# Patient Record
Sex: Female | Born: 1960 | Race: White | Hispanic: No | Smoking: Current every day smoker
Health system: Southern US, Community
[De-identification: ages and names within clinical notes are randomized; demographics above are authoritative.]

## PROBLEM LIST (undated history)

## (undated) DIAGNOSIS — K219 Gastro-esophageal reflux disease without esophagitis: Secondary | ICD-10-CM

## (undated) DIAGNOSIS — M797 Fibromyalgia: Secondary | ICD-10-CM

## (undated) DIAGNOSIS — I10 Essential (primary) hypertension: Secondary | ICD-10-CM

## (undated) DIAGNOSIS — F419 Anxiety disorder, unspecified: Secondary | ICD-10-CM

## (undated) DIAGNOSIS — J45909 Unspecified asthma, uncomplicated: Secondary | ICD-10-CM

## (undated) DIAGNOSIS — F32A Depression, unspecified: Secondary | ICD-10-CM

## (undated) DIAGNOSIS — I493 Ventricular premature depolarization: Secondary | ICD-10-CM

## (undated) HISTORY — PX: SINUS EXPLORATION: SHX5214

## (undated) HISTORY — PX: BREAST SURGERY: SHX581

## (undated) HISTORY — PX: COSMETIC SURGERY: SHX468

## (undated) HISTORY — PX: CHOLECYSTECTOMY: SHX55

## (undated) HISTORY — DX: Essential (primary) hypertension: I10

## (undated) HISTORY — PX: TUBAL LIGATION: SHX77

## (undated) HISTORY — PX: CARDIAC CATHETERIZATION: SHX172

## (undated) HISTORY — PX: BREAST ENHANCEMENT SURGERY: SHX7

## (undated) HISTORY — DX: Gastro-esophageal reflux disease without esophagitis: K21.9

## (undated) HISTORY — DX: Unspecified asthma, uncomplicated: J45.909

## (undated) HISTORY — PX: EYE SURGERY: SHX253

---

## 2001-10-18 ENCOUNTER — Emergency Department (HOSPITAL_COMMUNITY): Admission: EM | Admit: 2001-10-18 | Discharge: 2001-10-18 | Payer: Self-pay | Admitting: Emergency Medicine

## 2015-06-27 DIAGNOSIS — E782 Mixed hyperlipidemia: Secondary | ICD-10-CM | POA: Insufficient documentation

## 2016-05-06 ENCOUNTER — Encounter (HOSPITAL_COMMUNITY): Payer: Self-pay | Admitting: Psychiatry

## 2016-05-06 ENCOUNTER — Ambulatory Visit (INDEPENDENT_AMBULATORY_CARE_PROVIDER_SITE_OTHER): Payer: Medicaid Other | Admitting: Psychiatry

## 2016-05-06 VITALS — BP 119/80 | HR 84 | Ht 59.0 in | Wt 128.0 lb

## 2016-05-06 DIAGNOSIS — F4310 Post-traumatic stress disorder, unspecified: Secondary | ICD-10-CM

## 2016-05-06 DIAGNOSIS — F4001 Agoraphobia with panic disorder: Secondary | ICD-10-CM | POA: Diagnosis not present

## 2016-05-06 DIAGNOSIS — F411 Generalized anxiety disorder: Secondary | ICD-10-CM

## 2016-05-06 MED ORDER — ESCITALOPRAM OXALATE 5 MG PO TABS: 5.0000 mg | ORAL_TABLET | Freq: Every day | ORAL | Status: DC

## 2016-05-06 NOTE — Patient Instructions (Signed)
She can continue xanax 0.5mg  prn small dose by primary care. Has been on it for long time Will add lexapro small dose of 5mg  for PTSD
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
## 2016-05-07 DIAGNOSIS — R87610 Atypical squamous cells of undetermined significance on cytologic smear of cervix (ASC-US): Secondary | ICD-10-CM | POA: Insufficient documentation

## 2016-05-11 ENCOUNTER — Other Ambulatory Visit (HOSPITAL_COMMUNITY): Payer: Self-pay | Admitting: *Deleted

## 2016-05-11 ENCOUNTER — Telehealth (HOSPITAL_COMMUNITY): Payer: Self-pay | Admitting: *Deleted

## 2016-05-11 NOTE — Telephone Encounter (Signed)
PT states she is having a tingling sensation in hands and feet after she began taking Lexapro. Pt states she is feeling more anxious, weak and shaky. Please advise.

## 2016-05-11 NOTE — Telephone Encounter (Signed)
Reviewed chart. Patient already on xanax. Advise to take one now and if no improvement can go to local ED . Monitor BP , hydrate and get fresh air. Stop lexapro for now for 3 days.  Re introduce half tablet total of 2.5 after three days with meal.  Call back for concerns.

## 2016-05-11 NOTE — Telephone Encounter (Signed)
Return telephone call to pt. Per Dr. Gilmore LarocheAkhtar, please informed pt may take 1 xanax for now,  if no improvement can go to local ED . Monitor BP , hydrate and get fresh air.Stop Lexapro for now for 3 days. Re introduce half tablet total of 2.5 after three days with meal.  Pt may call back for an earlier appt. Pt verbalizes understanding.

## 2016-05-19 ENCOUNTER — Ambulatory Visit (INDEPENDENT_AMBULATORY_CARE_PROVIDER_SITE_OTHER): Payer: Medicaid Other | Admitting: Licensed Clinical Social Worker

## 2016-05-19 DIAGNOSIS — F411 Generalized anxiety disorder: Secondary | ICD-10-CM

## 2016-05-19 DIAGNOSIS — F4001 Agoraphobia with panic disorder: Secondary | ICD-10-CM | POA: Diagnosis not present

## 2016-05-19 DIAGNOSIS — F4310 Post-traumatic stress disorder, unspecified: Secondary | ICD-10-CM
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
## 2016-05-20 ENCOUNTER — Encounter (HOSPITAL_COMMUNITY): Payer: Self-pay | Admitting: Psychiatry

## 2016-05-20 ENCOUNTER — Ambulatory Visit (INDEPENDENT_AMBULATORY_CARE_PROVIDER_SITE_OTHER): Payer: Medicaid Other | Admitting: Psychiatry

## 2016-05-20 VITALS — BP 122/72 | HR 82 | Ht 59.0 in | Wt 127.0 lb

## 2016-05-20 DIAGNOSIS — F4001 Agoraphobia with panic disorder: Secondary | ICD-10-CM

## 2016-05-20 DIAGNOSIS — F411 Generalized anxiety disorder: Secondary | ICD-10-CM | POA: Diagnosis not present

## 2016-05-20 DIAGNOSIS — F4310 Post-traumatic stress disorder, unspecified: Secondary | ICD-10-CM | POA: Diagnosis not present

## 2016-05-20 MED ORDER — MIRTAZAPINE 7.5 MG PO TABS: 7.5000 mg | ORAL_TABLET | Freq: Every day | ORAL | Status: DC
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
## 2016-05-24 ENCOUNTER — Telehealth (HOSPITAL_COMMUNITY): Payer: Self-pay | Admitting: *Deleted

## 2016-05-24 NOTE — Telephone Encounter (Signed)
Pt states she is having muscle spasms since taking Remeron. Pt states she stop medication, but would like another medication. Please advise [phone].

## 2016-05-27 ENCOUNTER — Ambulatory Visit (INDEPENDENT_AMBULATORY_CARE_PROVIDER_SITE_OTHER): Payer: Medicaid Other | Admitting: Licensed Clinical Social Worker

## 2016-05-27 DIAGNOSIS — F4310 Post-traumatic stress disorder, unspecified: Secondary | ICD-10-CM

## 2016-05-27 DIAGNOSIS — F411 Generalized anxiety disorder: Secondary | ICD-10-CM | POA: Diagnosis not present

## 2016-05-27 DIAGNOSIS — F4001 Agoraphobia with panic disorder: Secondary | ICD-10-CM | POA: Diagnosis not present

## 2016-05-27 NOTE — Telephone Encounter (Signed)
Return telephone call to pt. Per Dr. Gilmore LarocheAkhtar, please informed pt to decrease Remeron to half dose, stay hydrated . Pt states she stop taking medication last Thursday, but would like to try another medication. Informed pt we will need to schedule an appt for medication changes. Pt is schedule for a f/u appt on 6/28. Pt verbalizes understanding.
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
## 2016-05-28 ENCOUNTER — Ambulatory Visit (HOSPITAL_COMMUNITY): Payer: Medicaid Other | Admitting: Psychiatry

## 2016-06-01 ENCOUNTER — Ambulatory Visit (INDEPENDENT_AMBULATORY_CARE_PROVIDER_SITE_OTHER): Payer: Medicaid Other | Admitting: Licensed Clinical Social Worker

## 2016-06-01 DIAGNOSIS — F4310 Post-traumatic stress disorder, unspecified: Secondary | ICD-10-CM | POA: Diagnosis not present

## 2016-06-01 DIAGNOSIS — F4001 Agoraphobia with panic disorder: Secondary | ICD-10-CM | POA: Diagnosis not present

## 2016-06-01 DIAGNOSIS — F411 Generalized anxiety disorder: Secondary | ICD-10-CM | POA: Diagnosis not present
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
## 2016-06-03 ENCOUNTER — Telehealth (HOSPITAL_COMMUNITY): Payer: Self-pay | Admitting: Psychiatry

## 2016-06-03 MED ORDER — ALPRAZOLAM 0.5 MG PO TABS: 0.5000 mg | ORAL_TABLET | Freq: Every day | ORAL | Status: DC | PRN

## 2016-06-03 NOTE — Telephone Encounter (Signed)
Printed xanax for pick up

## 2016-06-03 NOTE — Telephone Encounter (Signed)
Pt will need a rx written for Xanax 0.5mg . Pt is schedule for a f/u appt on 6/28.

## 2016-06-04 NOTE — Telephone Encounter (Signed)
Forde Radonavid Grafton picked up Xanax rx for Collene SchlichterLisa Kilman on 06/04/16 at 10:47 am. Mr. Sheppard CoilGrafton's DL id # is [id number].

## 2016-06-09 ENCOUNTER — Ambulatory Visit (HOSPITAL_COMMUNITY): Payer: Medicaid Other | Admitting: Psychiatry

## 2016-06-10 ENCOUNTER — Ambulatory Visit (HOSPITAL_COMMUNITY): Payer: Medicaid Other | Admitting: Psychiatry

## 2016-06-17 DIAGNOSIS — I493 Ventricular premature depolarization: Secondary | ICD-10-CM | POA: Insufficient documentation

## 2016-06-22 ENCOUNTER — Ambulatory Visit (INDEPENDENT_AMBULATORY_CARE_PROVIDER_SITE_OTHER): Payer: Medicaid Other | Admitting: Licensed Clinical Social Worker

## 2016-06-22 DIAGNOSIS — F411 Generalized anxiety disorder: Secondary | ICD-10-CM | POA: Diagnosis not present

## 2016-06-22 DIAGNOSIS — F4001 Agoraphobia with panic disorder: Secondary | ICD-10-CM | POA: Diagnosis not present

## 2016-06-22 DIAGNOSIS — F4310 Post-traumatic stress disorder, unspecified: Secondary | ICD-10-CM | POA: Diagnosis not present

## 2016-06-22 NOTE — Progress Notes (Signed)
THERAPIST PROGRESS NOTE  Session Time: 1 PM to 1:50 PM  Participation Level: Active  Behavioral Response: CasualAlertAnxious at times in session  Type of Therapy: Individual Therapy  Treatment Goals addressed: CopingAnxiety, use emotional regulation skills strategies to manage anxiety and panic, processes trauma symptoms to have better control of her symptoms  Interventions: CBT, Solution Focused, Supportive and Other: Psychoeducation on anxiety and coping strategies to manage anxiety  Summary: Collene SchlichterLisa Maday is a 55 y.o. female who presents with recently having oral surgery. She said she has been in pain and it has made the anxiety worse. Pointed that coming here still stresses her out. Patient reviewed situations where she is not sure why she has panic attacks and anxiety. It includes when she socializes and can only socialize for 35-40 minutes maximum and when she leaves the house. She is not able to identify any thoughts that contribute to her anxiety. She relates that possibly she is tired and has no energy and this contributes to her avoidance and anxiety. 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 She points out that in the past she's been able to handle situations where she did not have control so some of the strategies that may help with anxiety may not apply to her. Discussed that when she sees her grandson she is afraid that something may happen to her so she is concerned about taking care of him. She shared a story that when she was young she went to a funeral and this may have impressed her as when she slept with her grandmother she was always worried that something may happen to her grandmother.     Suicidal/Homicidal: No  Therapist Response: Therapist discussed how set beliefs may have happened from her trauma and from early life experiences that she may  not have been able to make sense of at the time but still a part of her belief system. Patient may not be aware of these thoughts. Based on behaviors and emotions therapist proposed a belief system that includes I'm not safe and something bad may happen. Therapist explained that these beliefs need to be challenged as they're based on past events that no longer appy to present situations. Therapist encouraged patient to look at thoughts as thoughts contributed to how we feel and challenged patient that there are automatic thoughts she is just not aware of. Therapist gave patient education about underlying causes of anxiety as well as strategies that can be effective as coping strategy. This includes the physiological reduction of arousal, such as focused breathing, evaluation of thoughts, and the selection of behaviors that intergrade arousal and movement. Explained long-term goals of treatment for anxiety are to replace feelings of helplessness and uncertainty with competence and confidence. Therapist work with patient on self-monitoring of feelings form since patient had not completed in between. Therapist explained this will be helpful for her to continue to monitor intense emotions and recognizes that she can intervene at different points in managing her feeling encouraged including the trigger, thoughts, and response. Therapist continues to reinforce mastery through not avoiding and repeating experiences so patient becomes more comfortable with them. Plan: Return again in 2 weeks.2. Patient continued to learn and apply coping strategies to manage anxiety  Diagnosis: Axis I: Panic disorder with agoraphobia, generalized anxiety disorder, PTSD    Axis II: No  diagnosis    Daejah Klebba A, LCSW 06/22/2016

## 2016-06-30 ENCOUNTER — Ambulatory Visit (INDEPENDENT_AMBULATORY_CARE_PROVIDER_SITE_OTHER): Payer: Medicaid Other | Admitting: Psychiatry

## 2016-06-30 DIAGNOSIS — F4001 Agoraphobia with panic disorder: Secondary | ICD-10-CM | POA: Diagnosis not present

## 2016-06-30 DIAGNOSIS — F411 Generalized anxiety disorder: Secondary | ICD-10-CM

## 2016-06-30 DIAGNOSIS — F4310 Post-traumatic stress disorder, unspecified: Secondary | ICD-10-CM

## 2016-06-30 MED ORDER — ALPRAZOLAM 0.5 MG PO TABS: 0.5000 mg | ORAL_TABLET | Freq: Every day | ORAL | Status: DC | PRN
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
## 2016-07-05 ENCOUNTER — Ambulatory Visit (INDEPENDENT_AMBULATORY_CARE_PROVIDER_SITE_OTHER): Payer: Medicaid Other | Admitting: Licensed Clinical Social Worker

## 2016-07-05 DIAGNOSIS — F4310 Post-traumatic stress disorder, unspecified: Secondary | ICD-10-CM

## 2016-07-05 DIAGNOSIS — F4001 Agoraphobia with panic disorder: Secondary | ICD-10-CM | POA: Diagnosis not present

## 2016-07-05 DIAGNOSIS — F411 Generalized anxiety disorder: Secondary | ICD-10-CM | POA: Diagnosis not present
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
## 2016-07-20 ENCOUNTER — Ambulatory Visit (HOSPITAL_COMMUNITY): Payer: Medicaid Other | Admitting: Licensed Clinical Social Worker

## 2016-08-23 ENCOUNTER — Ambulatory Visit (INDEPENDENT_AMBULATORY_CARE_PROVIDER_SITE_OTHER): Payer: Medicaid Other | Admitting: Licensed Clinical Social Worker

## 2016-08-23 DIAGNOSIS — F4310 Post-traumatic stress disorder, unspecified: Secondary | ICD-10-CM

## 2016-08-23 DIAGNOSIS — F411 Generalized anxiety disorder: Secondary | ICD-10-CM | POA: Diagnosis not present

## 2016-08-23 DIAGNOSIS — F4001 Agoraphobia with panic disorder: Secondary | ICD-10-CM | POA: Diagnosis not present

## 2016-08-23 NOTE — Progress Notes (Signed)
   THERAPIST PROGRESS NOTE  Session Time: 1:00 PM-1:55 PM  Participation Level: Active  Behavioral Response: CasualAlertEuthymic  Type of Therapy: Individual Therapy  Treatment Goals addressed:  Coping of Anxiety, use emotional regulation skills strategies to manage anxiety and panic, processes trauma symptoms to have better control of her symptoms  Interventions: CBT, Solution Focused, Supportive and Other: psychoeducation on panic and anxiety  Summary: Collene SchlichterLisa Savini is a 55 y.o. female and doctor related that it can cause anxiety, depression, dizziness, problems with sleep, and ringing in her ears. She has had some of these symptoms for the past nine years. The pain makes her think she is dying and can't sleep.Her doctor wants her to see a neurologist. Gave therapist update on symptoms and does not see any improvement. She only goes out when there is a doctor appointment. Gets out once a week or every two weeks. She can't identify negative predictions. She doesn't know know why it happens. Thinks it could be related to menopause. Explored a little further with patient and identify negative cognition with grand kids and worry that something negative would happen due to her aches and pains. Also, her kids don't give her an option of whether to watch kids and explored how this could be an underlying cause of anxiety. Explored as well motivations that cause her to not be able to tolerate spending too much time with people. She relates that she used to be afraid of diarrhea or vomiting but those symptoms are under control so this is not something she is fearful of. She explored history and explained that she anxiety attacks that got progressively worse and that it got worse three years ago.   Suicidal/Homicidal: No  Therapist Response: Reviewed current symptoms. Reviewed symptoms of fibromyalgia and discussed how medical issues can contribute and escalate current symptoms and addressing her medical  issues may help to improve symptoms. Educated patient on CBT and discussed the relationship of thoughts that cause our feelings that lead to actions. Explored patient's thoughts around her anxiety. Discussed how panic and agoraphobia related to negative predictions about the future. Encouraged patient to pay attention to thoughts more closely even though it is difficult for her to identify thoughts that lead to anxiety. Discussed how there may be unconscious thoughts leading to anxiety. Provided psychoeducation of how our flight and fight cause panic and related to misperception of dangerousness. Encouraged patient to challenge distorted thoughts. Discussed how current coping strategies are not helpful including avoidance and she needs to implement different coping strategies that are more helpful. Provided supportive interventions  Plan: Return again in 3 weeks.2.Patient continue to learn and apply coping skills to manage anxiety.3.Patient read handout on Panic  Diagnosis: Axis I:   Panic disorder with agoraphobia, generalized anxiety disorder, PTSD    Axis II: No diagnosis    Margree Gimbel A, LCSW 08/23/2016

## 2016-08-31 ENCOUNTER — Encounter (HOSPITAL_COMMUNITY): Payer: Self-pay | Admitting: Psychiatry

## 2016-09-14 ENCOUNTER — Ambulatory Visit (HOSPITAL_COMMUNITY): Payer: Medicaid Other | Admitting: Licensed Clinical Social Worker

## 2016-09-15 ENCOUNTER — Ambulatory Visit (HOSPITAL_COMMUNITY): Payer: Medicaid Other | Admitting: Psychiatry

## 2016-09-20 ENCOUNTER — Ambulatory Visit (INDEPENDENT_AMBULATORY_CARE_PROVIDER_SITE_OTHER): Payer: Medicaid Other | Admitting: Psychiatry

## 2016-09-20 ENCOUNTER — Encounter (HOSPITAL_COMMUNITY): Payer: Self-pay | Admitting: Psychiatry

## 2016-09-20 DIAGNOSIS — F17210 Nicotine dependence, cigarettes, uncomplicated: Secondary | ICD-10-CM | POA: Diagnosis not present

## 2016-09-20 DIAGNOSIS — F4001 Agoraphobia with panic disorder: Secondary | ICD-10-CM

## 2016-09-20 DIAGNOSIS — F4310 Post-traumatic stress disorder, unspecified: Secondary | ICD-10-CM

## 2016-09-20 DIAGNOSIS — F411 Generalized anxiety disorder: Secondary | ICD-10-CM | POA: Diagnosis not present

## 2016-09-20 DIAGNOSIS — Z79899 Other long term (current) drug therapy: Secondary | ICD-10-CM

## 2016-09-20 DIAGNOSIS — Z818 Family history of other mental and behavioral disorders: Secondary | ICD-10-CM

## 2016-09-20 MED ORDER — ALPRAZOLAM 0.5 MG PO TABS: 0.5000 mg | ORAL_TABLET | Freq: Every day | ORAL | 0 refills | Status: DC | PRN
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
## 2016-10-04 ENCOUNTER — Ambulatory Visit (INDEPENDENT_AMBULATORY_CARE_PROVIDER_SITE_OTHER): Payer: Medicaid Other | Admitting: Licensed Clinical Social Worker

## 2016-10-04 DIAGNOSIS — F411 Generalized anxiety disorder: Secondary | ICD-10-CM

## 2016-10-04 DIAGNOSIS — F4001 Agoraphobia with panic disorder: Secondary | ICD-10-CM | POA: Diagnosis not present

## 2016-10-04 DIAGNOSIS — F4310 Post-traumatic stress disorder, unspecified: Secondary | ICD-10-CM
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
## 2016-11-11 ENCOUNTER — Encounter (HOSPITAL_COMMUNITY): Payer: Self-pay | Admitting: Psychiatry

## 2016-11-11 ENCOUNTER — Ambulatory Visit (INDEPENDENT_AMBULATORY_CARE_PROVIDER_SITE_OTHER): Payer: Medicaid Other | Admitting: Psychiatry

## 2016-11-11 VITALS — HR 88 | Ht 59.0 in | Wt 127.0 lb

## 2016-11-11 DIAGNOSIS — F4001 Agoraphobia with panic disorder: Secondary | ICD-10-CM | POA: Diagnosis not present

## 2016-11-11 DIAGNOSIS — Z818 Family history of other mental and behavioral disorders: Secondary | ICD-10-CM | POA: Diagnosis not present

## 2016-11-11 DIAGNOSIS — F4310 Post-traumatic stress disorder, unspecified: Secondary | ICD-10-CM | POA: Diagnosis not present

## 2016-11-11 DIAGNOSIS — Z79899 Other long term (current) drug therapy: Secondary | ICD-10-CM

## 2016-11-11 DIAGNOSIS — Z882 Allergy status to sulfonamides status: Secondary | ICD-10-CM

## 2016-11-11 DIAGNOSIS — F17210 Nicotine dependence, cigarettes, uncomplicated: Secondary | ICD-10-CM

## 2016-11-11 DIAGNOSIS — Z888 Allergy status to other drugs, medicaments and biological substances status: Secondary | ICD-10-CM

## 2016-11-11 DIAGNOSIS — F411 Generalized anxiety disorder: Secondary | ICD-10-CM | POA: Diagnosis not present

## 2016-11-11 MED ORDER — VENLAFAXINE HCL ER 37.5 MG PO CP24: 37.5000 mg | ORAL_CAPSULE | Freq: Every day | ORAL | 1 refills | Status: DC

## 2016-11-11 MED ORDER — ALPRAZOLAM 0.5 MG PO TABS: 0.5000 mg | ORAL_TABLET | Freq: Every day | ORAL | 1 refills | Status: DC | PRN
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
## 2016-11-19 ENCOUNTER — Ambulatory Visit (HOSPITAL_COMMUNITY): Payer: Medicaid Other | Admitting: Licensed Clinical Social Worker

## 2016-12-14 ENCOUNTER — Ambulatory Visit (INDEPENDENT_AMBULATORY_CARE_PROVIDER_SITE_OTHER): Payer: Medicaid Other | Admitting: Licensed Clinical Social Worker

## 2016-12-14 DIAGNOSIS — F4001 Agoraphobia with panic disorder: Secondary | ICD-10-CM

## 2016-12-14 DIAGNOSIS — F4310 Post-traumatic stress disorder, unspecified: Secondary | ICD-10-CM

## 2016-12-14 DIAGNOSIS — F411 Generalized anxiety disorder: Secondary | ICD-10-CM | POA: Diagnosis not present

## 2016-12-14 NOTE — Progress Notes (Signed)
   THERAPIST PROGRESS NOTE  Session Time: 2:05 PM to 2:55 PM  Participation Level: Active  Behavioral Response: CasualAlertEuthymic  Type of Therapy: Individual Therapy  Treatment Goals addressed:  Coping of Anxiety, use emotional regulation skills strategies to manage anxiety and panic, processes trauma symptoms to have better control of her symptoms  Interventions: CBT, Solution Focused, Strength-based and Supportive  Summary: Collene SchlichterLisa Griffeth is a 56 y.o. female who presents with some positive experiences over the holidays. She watched her 5521 mth year old grandkidand 54 years old grandkid for Christmas Eve and went over to brothers house for a short period on Christmas day. Reviewed treatment goals patient still feels limited in her ability to leave the house and management of her anxiety and she will continue to work on insight to triggers and coping strategies. Discussed that we are working on emotional regulation skills and skills and interpersonal relationships to build skills to start trauma work. Patient discussed problems with medications both for her mental health and fibromyalgia and is willing to go to see a neurologist to help her with pain issues. Explored some of the reasons for limited progress and encouraged patient to apply strategies for them to be effective. Patient has difficulty identifying triggers and she has trouble identifying thoughts that trigger anxiety. For example, she doesn't think about the worst case scenario and says in some cases it would be a positive experience if she did not give into anxiety. Discussed that changing her expectations could help her respond differently. Described spending time with grandchild who she watches as a positive experience and he "makes me do things" and describes positive experiences as a result. She feels that pain does contribute to her not willing to do things and is motivated to see a neurologist. Describes panic symptoms that feel  like a heart attack in session. Difficult for her to challenge thoughts as she has a heart condition. Patient feels that sleep and hormonal problems could relate to escalation of anxiety symptoms and will see psychiatrist to work on finding a helpful medication.   Suicidal/Homicidal: No  Therapist Response: Utilized therapeutic relationship and therapist assessment to explore why limited progress is made with CBT, DBT and interpersonal relationships strategies. Therapist encouraged patient to look at how anxiety is often related to lack of control of external events and also raise insight to limited ability to control things that are external to help let go of need to control. Encouraged patient to challenge her symptoms, recognize their irrationality and how they have limited her ability to function. Discussed strategy of not to catastrophize symptoms so as not to escalate symptoms. Encouraged patient to look at the rewards of pushing herself as motivation to push herself out of her comfort zone. Viewed treatment goals and patient is to continue to work on management of anxiety, trauma symptoms and better functionality. Used supportive and strength-based interventions.   Plan: Return again in 2 weeks.2. Patient continue to work with therapists on insight to triggers and coping strategies for anxiety and implement in life situations.   Diagnosis: Axis I:  Panic disorder with agoraphobia, generalized anxiety disorder, PTSD    Axis II: No diagnosis    Zachry Hopfensperger A, LCSW 12/14/2016

## 2016-12-17 ENCOUNTER — Ambulatory Visit (HOSPITAL_COMMUNITY): Payer: Medicaid Other | Admitting: Psychiatry

## 2016-12-23 ENCOUNTER — Ambulatory Visit (HOSPITAL_COMMUNITY): Payer: Medicaid Other | Admitting: Psychiatry

## 2016-12-28 ENCOUNTER — Ambulatory Visit (HOSPITAL_COMMUNITY): Payer: Medicaid Other | Admitting: Licensed Clinical Social Worker

## 2016-12-31 ENCOUNTER — Ambulatory Visit (HOSPITAL_COMMUNITY): Payer: Medicaid Other | Admitting: Psychiatry

## 2017-01-06 ENCOUNTER — Ambulatory Visit (INDEPENDENT_AMBULATORY_CARE_PROVIDER_SITE_OTHER): Payer: Medicaid Other | Admitting: Psychiatry

## 2017-01-06 ENCOUNTER — Encounter (HOSPITAL_COMMUNITY): Payer: Self-pay | Admitting: Psychiatry

## 2017-01-06 VITALS — BP 118/68 | HR 93 | Resp 18 | Ht 59.0 in | Wt 129.0 lb

## 2017-01-06 DIAGNOSIS — Z818 Family history of other mental and behavioral disorders: Secondary | ICD-10-CM

## 2017-01-06 DIAGNOSIS — F411 Generalized anxiety disorder: Secondary | ICD-10-CM | POA: Diagnosis not present

## 2017-01-06 DIAGNOSIS — F4310 Post-traumatic stress disorder, unspecified: Secondary | ICD-10-CM | POA: Diagnosis not present

## 2017-01-06 DIAGNOSIS — F17210 Nicotine dependence, cigarettes, uncomplicated: Secondary | ICD-10-CM

## 2017-01-06 DIAGNOSIS — F4001 Agoraphobia with panic disorder: Secondary | ICD-10-CM

## 2017-01-06 DIAGNOSIS — Z888 Allergy status to other drugs, medicaments and biological substances status: Secondary | ICD-10-CM

## 2017-01-06 DIAGNOSIS — Z79899 Other long term (current) drug therapy: Secondary | ICD-10-CM | POA: Diagnosis not present

## 2017-01-06 DIAGNOSIS — Z882 Allergy status to sulfonamides status: Secondary | ICD-10-CM

## 2017-01-06 MED ORDER — ALPRAZOLAM 0.5 MG PO TABS: 0.5000 mg | ORAL_TABLET | Freq: Every day | ORAL | 0 refills | Status: DC | PRN

## 2017-01-06 MED ORDER — MIRTAZAPINE 7.5 MG PO TABS: 7.5000 mg | ORAL_TABLET | Freq: Every day | ORAL | 0 refills | Status: DC
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
## 2017-02-01 ENCOUNTER — Telehealth (HOSPITAL_COMMUNITY): Payer: Self-pay | Admitting: Licensed Clinical Social Worker

## 2017-02-01 ENCOUNTER — Ambulatory Visit (INDEPENDENT_AMBULATORY_CARE_PROVIDER_SITE_OTHER): Payer: Medicaid Other | Admitting: Psychiatry

## 2017-02-01 ENCOUNTER — Encounter (HOSPITAL_COMMUNITY): Payer: Self-pay | Admitting: Psychiatry

## 2017-02-01 DIAGNOSIS — Z882 Allergy status to sulfonamides status: Secondary | ICD-10-CM

## 2017-02-01 DIAGNOSIS — F4001 Agoraphobia with panic disorder: Secondary | ICD-10-CM

## 2017-02-01 DIAGNOSIS — Z818 Family history of other mental and behavioral disorders: Secondary | ICD-10-CM

## 2017-02-01 DIAGNOSIS — F411 Generalized anxiety disorder: Secondary | ICD-10-CM | POA: Diagnosis not present

## 2017-02-01 DIAGNOSIS — F4310 Post-traumatic stress disorder, unspecified: Secondary | ICD-10-CM | POA: Diagnosis not present

## 2017-02-01 DIAGNOSIS — F17210 Nicotine dependence, cigarettes, uncomplicated: Secondary | ICD-10-CM

## 2017-02-01 DIAGNOSIS — Z79899 Other long term (current) drug therapy: Secondary | ICD-10-CM

## 2017-02-01 DIAGNOSIS — Z880 Allergy status to penicillin: Secondary | ICD-10-CM

## 2017-02-01 MED ORDER — AMITRIPTYLINE HCL 10 MG PO TABS: 10.0000 mg | ORAL_TABLET | Freq: Every day | ORAL | 0 refills | Status: DC
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
## 2017-02-01 NOTE — Telephone Encounter (Signed)
We discussed this Martha Roberts about a month ago. Martha Roberts would like to see you. She feels that [facility]and[facility] is not a good fit for her.

## 2017-02-04 NOTE — Telephone Encounter (Signed)
Go ahead and schedule her with me.

## 2017-02-09 NOTE — Telephone Encounter (Signed)
Appointment has been made for Martha Roberts. Nothing further needed at this time.

## 2017-02-11 ENCOUNTER — Encounter (HOSPITAL_COMMUNITY): Payer: Self-pay | Admitting: *Deleted

## 2017-02-22 ENCOUNTER — Ambulatory Visit (INDEPENDENT_AMBULATORY_CARE_PROVIDER_SITE_OTHER): Payer: Medicaid Other | Admitting: Psychiatry

## 2017-02-22 ENCOUNTER — Encounter (HOSPITAL_COMMUNITY): Payer: Self-pay | Admitting: Psychiatry

## 2017-02-22 VITALS — BP 118/68 | HR 89 | Resp 18 | Ht 59.0 in | Wt 130.0 lb

## 2017-02-22 DIAGNOSIS — F4001 Agoraphobia with panic disorder: Secondary | ICD-10-CM

## 2017-02-22 DIAGNOSIS — F341 Dysthymic disorder: Secondary | ICD-10-CM

## 2017-02-22 DIAGNOSIS — Z818 Family history of other mental and behavioral disorders: Secondary | ICD-10-CM

## 2017-02-22 DIAGNOSIS — F4310 Post-traumatic stress disorder, unspecified: Secondary | ICD-10-CM

## 2017-02-22 DIAGNOSIS — Z882 Allergy status to sulfonamides status: Secondary | ICD-10-CM

## 2017-02-22 DIAGNOSIS — Z888 Allergy status to other drugs, medicaments and biological substances status: Secondary | ICD-10-CM

## 2017-02-22 DIAGNOSIS — Z79899 Other long term (current) drug therapy: Secondary | ICD-10-CM

## 2017-02-22 DIAGNOSIS — F411 Generalized anxiety disorder: Secondary | ICD-10-CM

## 2017-02-22 DIAGNOSIS — F17210 Nicotine dependence, cigarettes, uncomplicated: Secondary | ICD-10-CM

## 2017-02-22 MED ORDER — BUPROPION HCL ER (SR) 100 MG PO TB12: 100.0000 mg | ORAL_TABLET | Freq: Two times a day (BID) | ORAL | 0 refills | Status: DC

## 2017-02-22 MED ORDER — ALPRAZOLAM 0.5 MG PO TABS: 0.5000 mg | ORAL_TABLET | Freq: Every day | ORAL | 0 refills | Status: DC | PRN
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
## 2017-02-23 ENCOUNTER — Telehealth (HOSPITAL_COMMUNITY): Payer: Self-pay | Admitting: Psychiatry

## 2017-02-23 NOTE — Telephone Encounter (Signed)
Pt needs clarification on wellbutrin instructions. What she was told is different than what the bottle says,.

## 2017-02-23 NOTE — Telephone Encounter (Signed)
Lvm for pt to return call to office.  

## 2017-02-24 NOTE — Telephone Encounter (Signed)
Yes it is supposed to be once a day.  It printed out as twice a day  Take once a day only till I see in visit

## 2017-02-25 NOTE — Telephone Encounter (Signed)
Lvm for pt clarifying Wellbutrin Rx instructions. Per Dr. Gilmore LarocheAkhtar, pt is instructed to take 1 tablet (100mg ) daily until next visit on 03/22/17. Nothing further is need at this time.

## 2017-03-07 ENCOUNTER — Ambulatory Visit (INDEPENDENT_AMBULATORY_CARE_PROVIDER_SITE_OTHER): Payer: Medicaid Other | Admitting: Licensed Clinical Social Worker

## 2017-03-07 DIAGNOSIS — F4310 Post-traumatic stress disorder, unspecified: Secondary | ICD-10-CM | POA: Diagnosis not present

## 2017-03-07 DIAGNOSIS — F4001 Agoraphobia with panic disorder: Secondary | ICD-10-CM

## 2017-03-07 DIAGNOSIS — F341 Dysthymic disorder: Secondary | ICD-10-CM

## 2017-03-07 NOTE — Progress Notes (Signed)
   THERAPIST PROGRESS NOTE  Session Time: 1:05pm-2:00pm  Participation Level: Active  Behavioral Response: DisheveledAlertAnxious  Type of Therapy: Individual Therapy  Treatment Goals addressed: reduce anxiety associated with leaving the house and increase participation and enjoyment of activities  Interventions: Assessment, psycho-ed about anxiety  Suicidal/Homicidal: Denied both  Therapist Interventions: Gathered information about reasons for switching therapists.  Reviewed patient's treatment plan.  Learned about patient's history of anxiety and how the symptoms have worsened in the past couple years.  Reviewed the concept of fight or flight.  Emphasized that the symptoms of panic are not dangerous.      Summary: Described how she has developed agoraphobia in the last couple of years.  Noted how she used to have no problem going to work, out to a venue to listen to music, or go out to eat.  Now she can't stand to be away from home for more than 30 minutes.  She is unsure about why this fear has developed.  Noted that ever since she hit menopause she has had problems with incontinence.  This certainly hasn't helped her in terms of having motivation to get out of the house.  Reported she takes Xanax regularly.  Currently averages less than one pill per day.  Noted she has been prescribed Xanax since the age of 56. Somewhat familiar with the concept of fight or flight.  When told about how panic symptoms are not dangerous she noted that it sometimes feels like she is having a heart attack and on more than one occasion she has gone to the ER thinking that was what was happening.         Talked about how she would like to get to the point where she feels comfortable enough to spend time outside of her home so she can spend more time with her grandchildren.      Plan: Will focus on psycho-ed about PTSD at next session.  Diagnosis:  Agoraphobia with panic  disorder PTSD Dysthymia    Darrin LuisSolomon, Pryor Guettler A, LCSW 03/07/2017

## 2017-03-21 ENCOUNTER — Ambulatory Visit (INDEPENDENT_AMBULATORY_CARE_PROVIDER_SITE_OTHER): Payer: Medicaid Other | Admitting: Licensed Clinical Social Worker

## 2017-03-21 DIAGNOSIS — F4310 Post-traumatic stress disorder, unspecified: Secondary | ICD-10-CM | POA: Diagnosis not present

## 2017-03-21 DIAGNOSIS — F4001 Agoraphobia with panic disorder: Secondary | ICD-10-CM | POA: Diagnosis not present

## 2017-03-21 DIAGNOSIS — F341 Dysthymic disorder: Secondary | ICD-10-CM

## 2017-03-21 DIAGNOSIS — F411 Generalized anxiety disorder: Secondary | ICD-10-CM | POA: Diagnosis not present

## 2017-03-21 NOTE — Progress Notes (Signed)
   THERAPIST PROGRESS NOTE  Session Time: 2:00pm-3:00pm  Participation Level: Active  Behavioral Response: DisheveledAlertAnxious  Shaking quite a bit  Type of Therapy: Individual Therapy  Treatment Goals addressed: reduce anxiety associated with leaving the house and increase participation and enjoyment of activities  Interventions: Psycho-ed about PTSD  Suicidal/Homicidal: Denied both  Therapist Interventions: Had patient complete a PCL-5 to assess for presence and severity of PTSD-related symptoms.  Reviewed symptoms of PTSD.  Emphasized that is it normal to have a variety of difficulties following a traumatic event, even years later.           Summary:   Score of the PCL-5 was 60.  Items she rated as bothering her quite a bit or extremely included: Repeated disturbing memories Feeling upset when coming in contact with trauma triggers Experiencing panic symptoms when coming in contact with trauma triggers Avoidance of memories, thoughts, feelings related to traumatic events Strong negative beliefs about self, others, and the world Strong negative feelings such as fear, horror, anger, guilt, or shame Loss of interest in activities Feeling detached from others Trouble experiencing positive feelings Excessive irritability Hypervigilence Difficulty concentrating Trouble falling and staying asleep   Admitted that she did not know a lot about PTSD.    Reported she was proud of herself for attending her grandson's birthday. Managed to stay for an hour and a half.   Plan: Return in approximately 2 weeks.  May introduce mindfulness.  Diagnosis:  Agoraphobia with panic disorder PTSD Dysthymia    Darrin Luis 03/21/2017

## 2017-03-22 ENCOUNTER — Ambulatory Visit (HOSPITAL_COMMUNITY): Payer: Medicaid Other | Admitting: Psychiatry

## 2017-03-23 ENCOUNTER — Encounter (HOSPITAL_COMMUNITY): Payer: Self-pay | Admitting: Psychiatry

## 2017-03-23 ENCOUNTER — Ambulatory Visit (INDEPENDENT_AMBULATORY_CARE_PROVIDER_SITE_OTHER): Payer: Medicaid Other | Admitting: Psychiatry

## 2017-03-23 VITALS — BP 126/68 | HR 94 | Resp 16 | Ht 59.0 in | Wt 130.0 lb

## 2017-03-23 DIAGNOSIS — F4001 Agoraphobia with panic disorder: Secondary | ICD-10-CM | POA: Diagnosis not present

## 2017-03-23 DIAGNOSIS — Z79899 Other long term (current) drug therapy: Secondary | ICD-10-CM | POA: Diagnosis not present

## 2017-03-23 DIAGNOSIS — F4310 Post-traumatic stress disorder, unspecified: Secondary | ICD-10-CM

## 2017-03-23 DIAGNOSIS — F341 Dysthymic disorder: Secondary | ICD-10-CM | POA: Diagnosis not present

## 2017-03-23 DIAGNOSIS — F411 Generalized anxiety disorder: Secondary | ICD-10-CM | POA: Diagnosis not present

## 2017-03-23 DIAGNOSIS — F17210 Nicotine dependence, cigarettes, uncomplicated: Secondary | ICD-10-CM | POA: Diagnosis not present

## 2017-03-23 DIAGNOSIS — Z818 Family history of other mental and behavioral disorders: Secondary | ICD-10-CM

## 2017-03-23 DIAGNOSIS — M797 Fibromyalgia: Secondary | ICD-10-CM

## 2017-03-23 MED ORDER — GABAPENTIN 100 MG PO CAPS: 100.0000 mg | ORAL_CAPSULE | Freq: Two times a day (BID) | ORAL | 0 refills | Status: DC

## 2017-03-23 MED ORDER — ALPRAZOLAM 0.5 MG PO TABS: 0.5000 mg | ORAL_TABLET | Freq: Every day | ORAL | 0 refills | Status: DC | PRN

## 2017-03-23 MED ORDER — BUPROPION HCL ER (SR) 150 MG PO TB12: 150.0000 mg | ORAL_TABLET | Freq: Every day | ORAL | 0 refills | Status: DC
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
## 2017-04-07 ENCOUNTER — Other Ambulatory Visit (HOSPITAL_COMMUNITY): Payer: Self-pay | Admitting: Psychiatry

## 2017-04-08 ENCOUNTER — Ambulatory Visit (HOSPITAL_COMMUNITY): Payer: Medicaid Other | Admitting: Licensed Clinical Social Worker

## 2017-04-11 NOTE — Telephone Encounter (Signed)
Received fax from [facility] requesting a refill for Xanax. Per Dr. Gilmore Laroche, refill request for Xanax is denied. Pt received a printed Rx on 03/23/17. Pt next apt is schedule on 04/14/17. Nothing further is needed at this time.

## 2017-04-14 ENCOUNTER — Encounter (HOSPITAL_COMMUNITY): Payer: Self-pay | Admitting: Psychiatry

## 2017-04-14 ENCOUNTER — Ambulatory Visit (INDEPENDENT_AMBULATORY_CARE_PROVIDER_SITE_OTHER): Payer: Medicaid Other | Admitting: Psychiatry

## 2017-04-14 ENCOUNTER — Other Ambulatory Visit (HOSPITAL_COMMUNITY): Payer: Self-pay | Admitting: Psychiatry

## 2017-04-14 VITALS — BP 122/74 | HR 75 | Resp 16 | Ht 59.0 in | Wt 129.0 lb

## 2017-04-14 DIAGNOSIS — F17210 Nicotine dependence, cigarettes, uncomplicated: Secondary | ICD-10-CM

## 2017-04-14 DIAGNOSIS — F4001 Agoraphobia with panic disorder: Secondary | ICD-10-CM

## 2017-04-14 DIAGNOSIS — Z79899 Other long term (current) drug therapy: Secondary | ICD-10-CM

## 2017-04-14 DIAGNOSIS — F341 Dysthymic disorder: Secondary | ICD-10-CM | POA: Diagnosis not present

## 2017-04-14 DIAGNOSIS — M797 Fibromyalgia: Secondary | ICD-10-CM

## 2017-04-14 DIAGNOSIS — Z818 Family history of other mental and behavioral disorders: Secondary | ICD-10-CM

## 2017-04-14 DIAGNOSIS — F4310 Post-traumatic stress disorder, unspecified: Secondary | ICD-10-CM | POA: Diagnosis not present

## 2017-04-14 MED ORDER — BUPROPION HCL ER (SR) 100 MG PO TB12: 100.0000 mg | ORAL_TABLET | Freq: Every day | ORAL | 1 refills | Status: DC

## 2017-04-14 MED ORDER — ALPRAZOLAM 0.5 MG PO TABS: 0.5000 mg | ORAL_TABLET | Freq: Every day | ORAL | 0 refills | Status: DC | PRN
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
## 2017-04-15 NOTE — Telephone Encounter (Signed)
[facility] Wellbutrin. Per Dr. Gilmore LarocheAkhtar, refill request is denied. Pt was seen in clinic on 04/14/17. Refills were sent to pharmacy for a 30 day supply. Pharmacy notified. Nothing further is need at this time.

## 2017-04-18 ENCOUNTER — Ambulatory Visit (INDEPENDENT_AMBULATORY_CARE_PROVIDER_SITE_OTHER): Payer: Medicaid Other | Admitting: Licensed Clinical Social Worker

## 2017-04-18 DIAGNOSIS — F341 Dysthymic disorder: Secondary | ICD-10-CM | POA: Diagnosis not present

## 2017-04-18 DIAGNOSIS — F4001 Agoraphobia with panic disorder: Secondary | ICD-10-CM | POA: Diagnosis not present

## 2017-04-18 DIAGNOSIS — F4310 Post-traumatic stress disorder, unspecified: Secondary | ICD-10-CM

## 2017-04-18 NOTE — Progress Notes (Signed)
   THERAPIST PROGRESS NOTE  Session Time: 2:05pm-3:05pm  Participation Level: Active  Behavioral Response: Casual  Alert  Mostly Euthymic  Type of Therapy: Individual Therapy  Treatment Goals addressed: reduce anxiety associated with leaving the house and increase participation and enjoyment of activities  Interventions: Mindfulness  Suicidal/Homicidal: Denied both  Therapist Interventions: Introduced the concept of mindfulness.  Emphasized how learning to focus on the present can help you to feel more in control of your emotions.  Explained how it can be useful to practice at times when you catch yourself having unhelpful thoughts.  Described how you can choose to do tasks in a mindful way.  Guided patient through practicing mindfulness in different ways including focusing on an object and mindful breathing.  Encouraged patient to practice the skills regularly in addition to times of distress.  Recommended two particular mindfulness apps to download:  The Mindfulness App and Relax Melodies.  Summary:  Was not familiar with mindfulness.  Seemed to understand concepts discussed.  Noted that her mind tends to race so she anticipates practicing mindfulness to be challenging, but expressed intentions of practicing it nonetheless.      Reported that since her last session she has been much more conscious of when traumatic memories are popping in her head.  She said it happens several times a day.       Plan: Return in approximately 2 weeks.  Will check on implementation of mindfulness skills.  May guide her through practicing a breathing exercise called the 4-7-8 Breath.  Diagnosis:  Agoraphobia with panic disorder PTSD Dysthymia    Darrin LuisSolomon, Marypat Kimmet A, LCSW 04/18/2017

## 2017-05-12 ENCOUNTER — Ambulatory Visit (HOSPITAL_COMMUNITY): Payer: Medicaid Other | Admitting: Licensed Clinical Social Worker

## 2017-05-17 ENCOUNTER — Ambulatory Visit (HOSPITAL_COMMUNITY): Payer: Medicaid Other | Admitting: Licensed Clinical Social Worker

## 2017-06-02 ENCOUNTER — Ambulatory Visit (INDEPENDENT_AMBULATORY_CARE_PROVIDER_SITE_OTHER): Payer: 59 | Admitting: Licensed Clinical Social Worker

## 2017-06-02 DIAGNOSIS — F4310 Post-traumatic stress disorder, unspecified: Secondary | ICD-10-CM

## 2017-06-02 DIAGNOSIS — M797 Fibromyalgia: Secondary | ICD-10-CM | POA: Diagnosis not present

## 2017-06-02 DIAGNOSIS — F341 Dysthymic disorder: Secondary | ICD-10-CM

## 2017-06-02 DIAGNOSIS — F4001 Agoraphobia with panic disorder: Secondary | ICD-10-CM

## 2017-06-03 NOTE — Progress Notes (Signed)
   THERAPIST PROGRESS NOTE  Session Time: 2:05pm-3:10pm  Participation Level: Active  Behavioral Response: Casual  Alert  Anxious  Type of Therapy: Individual Therapy  Treatment Goals addressed: reduce frequency and intensity of panic attacks  Interventions: Treatment plan review  Suicidal/Homicidal: Denied both  Therapist Interventions: Reviewed progress with treatment goals.  Concluded that symptoms of anxiety have persisted at the same level or sometimes worse than she was experiencing a year ago.  Decided to discontinue treatment goals and simplify her treatment plan to one goal. Learned more about patient's history of use of psych meds for treating her anxiety.  Suggested looking into testing through [facility] to get a more comprehensive idea of what medications would be most appropriate for her.  Encouraged her to ask our psychiatrist about it.       Summary:  Reflecting on the past year, patient reported that in some ways her anxiety has gotten worse.  Now in addition to only being able to tolerate spending a limited time away from home, she experiences anxiety whenever she has visitors in her own home, especially when the visit is unexpected.  The frequency of her panic attacks depends greatly on whether or not she is away from home.  She avoids leaving the house as much as possible.  Has a friend pick up her groceries for her from [employer].  Does sometimes have panic attacks out of the blue.   Developed the following new treatment goal: Martha Roberts will report a noticeable decrease in frequency and intensity of panic attacks.  Admitted to taking herself off of all of her medications except atenolol last month because she was trying to determine if the muscle spasms she was having in her neck were a side effect of one of her medications.  She experienced this phenomenon about 9 times.  Now she is back on all of her meds except for Wellbutrin.  Admits to being hesitant to restart that  medication because she suspects that was the one causing the problem.  Will talk about this with psychiatrist at her med management appointment next week.             Plan:   May guide her through practicing a breathing exercise called the 4-7-8 Breath at next session.  Diagnosis:  Panic disorder with agoraphobia PTSD Dysthymia    Darrin LuisSolomon, [name], LCSW 06/02/2017

## 2017-06-09 ENCOUNTER — Ambulatory Visit (HOSPITAL_COMMUNITY): Payer: Medicaid Other | Admitting: Psychiatry

## 2017-06-21 ENCOUNTER — Ambulatory Visit (INDEPENDENT_AMBULATORY_CARE_PROVIDER_SITE_OTHER): Payer: 59 | Admitting: Licensed Clinical Social Worker

## 2017-06-21 DIAGNOSIS — F341 Dysthymic disorder: Secondary | ICD-10-CM

## 2017-06-21 DIAGNOSIS — F4001 Agoraphobia with panic disorder: Secondary | ICD-10-CM

## 2017-06-21 DIAGNOSIS — F4310 Post-traumatic stress disorder, unspecified: Secondary | ICD-10-CM

## 2017-06-22 NOTE — Progress Notes (Signed)
   THERAPIST PROGRESS NOTE  Session Time: 1:05pm-1:55pm  Participation Level: Active  Behavioral Response: Casual  Alert  Anxious  Type of Therapy: Individual Therapy  Treatment Goals addressed: reduce frequency and intensity of panic attacks  Interventions: Relaxation training  Suicidal/Homicidal: Denied both  Therapist Interventions:   Explored patient's theory about her panic attacks and agoraphobia somehow being associated with going into menopause about three years ago.   Taught patient Roberts specific breathing exercise called the 4-7-8 Breath.  Had her watch Roberts video of someone demonstrating the technique.  Recommended practicing the exercise at least once Roberts day.       Summary:  Reported that she went back on Wellbutrin within the past couple weeks.  Noted that it does seem to help reduce feelings of impending doom, but it also seems to have side effects of "making her insides shake" and keeping her up all night.  Admits to taking Xanax when the shaking sensation gets to be too much.   Reflected on how she has always had issues with anxiety but the symptoms did not become debilitating until she went into menopause.  All of Roberts sudden she found she couldn't handle being at work.  Shortly thereafter she started having bouts of diarrhea whenever she would leave home.  She often chose not to leave home in order to avoid having accidents.  This problem has persisted.   Reported she has done Roberts similar breathing exercise to the one reviewed today.  She acknowledged there are times when the deep breathing has been effective for reducing her anxiety.  Noted that in general when she experiences panic symptoms she will do focused breathing for 45 minutes to one hour before resorting to taking Roberts Xanax.        Plan:  Scheduled to return in approximately two weeks.  Diagnosis:  Panic disorder with agoraphobia PTSD Dysthymia    Darrin LuisSolomon, Martha Gartman A, LCSW 06/21/2017

## 2017-07-04 ENCOUNTER — Ambulatory Visit (INDEPENDENT_AMBULATORY_CARE_PROVIDER_SITE_OTHER): Payer: 59 | Admitting: Psychiatry

## 2017-07-04 ENCOUNTER — Encounter (HOSPITAL_COMMUNITY): Payer: Self-pay | Admitting: Psychiatry

## 2017-07-04 DIAGNOSIS — F341 Dysthymic disorder: Secondary | ICD-10-CM | POA: Diagnosis not present

## 2017-07-04 DIAGNOSIS — Z818 Family history of other mental and behavioral disorders: Secondary | ICD-10-CM

## 2017-07-04 DIAGNOSIS — F4310 Post-traumatic stress disorder, unspecified: Secondary | ICD-10-CM | POA: Diagnosis not present

## 2017-07-04 DIAGNOSIS — G4700 Insomnia, unspecified: Secondary | ICD-10-CM

## 2017-07-04 DIAGNOSIS — F17210 Nicotine dependence, cigarettes, uncomplicated: Secondary | ICD-10-CM

## 2017-07-04 DIAGNOSIS — M797 Fibromyalgia: Secondary | ICD-10-CM

## 2017-07-04 DIAGNOSIS — F4001 Agoraphobia with panic disorder: Secondary | ICD-10-CM

## 2017-07-04 MED ORDER — GABAPENTIN 100 MG PO CAPS: 100.0000 mg | ORAL_CAPSULE | Freq: Three times a day (TID) | ORAL | 1 refills | Status: DC

## 2017-07-04 MED ORDER — BUPROPION HCL 100 MG PO TABS: 100.0000 mg | ORAL_TABLET | Freq: Every day | ORAL | 1 refills | Status: DC
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
## 2017-07-07 ENCOUNTER — Ambulatory Visit (HOSPITAL_COMMUNITY): Payer: 59 | Admitting: Licensed Clinical Social Worker

## 2017-07-21 ENCOUNTER — Ambulatory Visit (HOSPITAL_COMMUNITY): Payer: Medicare Other | Admitting: Licensed Clinical Social Worker

## 2017-07-22 ENCOUNTER — Telehealth (HOSPITAL_COMMUNITY): Payer: Self-pay | Admitting: Psychiatry

## 2017-07-22 NOTE — Telephone Encounter (Signed)
Talked with dr. Tresa EndoKelly. It was not related to medication or any urgent need. Will talk with patient at visit and review her therapy goals

## 2017-07-22 NOTE — Telephone Encounter (Signed)
[facility] at [facility]- would like to speak to you in reguards to this patient's care.  You can reach him on his cell at [phone].

## 2017-08-30 ENCOUNTER — Encounter (HOSPITAL_COMMUNITY): Payer: Self-pay | Admitting: Psychiatry

## 2017-08-30 ENCOUNTER — Ambulatory Visit (INDEPENDENT_AMBULATORY_CARE_PROVIDER_SITE_OTHER): Payer: Medicare Other | Admitting: Psychiatry

## 2017-08-30 DIAGNOSIS — F341 Dysthymic disorder: Secondary | ICD-10-CM | POA: Diagnosis not present

## 2017-08-30 DIAGNOSIS — F332 Major depressive disorder, recurrent severe without psychotic features: Secondary | ICD-10-CM

## 2017-08-30 DIAGNOSIS — Z62810 Personal history of physical and sexual abuse in childhood: Secondary | ICD-10-CM

## 2017-08-30 DIAGNOSIS — M797 Fibromyalgia: Secondary | ICD-10-CM

## 2017-08-30 DIAGNOSIS — Z818 Family history of other mental and behavioral disorders: Secondary | ICD-10-CM

## 2017-08-30 DIAGNOSIS — M791 Myalgia: Secondary | ICD-10-CM

## 2017-08-30 DIAGNOSIS — F411 Generalized anxiety disorder: Secondary | ICD-10-CM | POA: Diagnosis not present

## 2017-08-30 DIAGNOSIS — G4700 Insomnia, unspecified: Secondary | ICD-10-CM

## 2017-08-30 DIAGNOSIS — F4001 Agoraphobia with panic disorder: Secondary | ICD-10-CM

## 2017-08-30 DIAGNOSIS — F4310 Post-traumatic stress disorder, unspecified: Secondary | ICD-10-CM | POA: Diagnosis not present

## 2017-08-30 DIAGNOSIS — F515 Nightmare disorder: Secondary | ICD-10-CM

## 2017-08-30 DIAGNOSIS — F17210 Nicotine dependence, cigarettes, uncomplicated: Secondary | ICD-10-CM

## 2017-08-30 MED ORDER — GABAPENTIN 100 MG PO CAPS: 100.0000 mg | ORAL_CAPSULE | Freq: Three times a day (TID) | ORAL | 0 refills | Status: DC

## 2017-08-30 MED ORDER — ALPRAZOLAM 0.5 MG PO TABS: 0.5000 mg | ORAL_TABLET | Freq: Every day | ORAL | 0 refills | Status: DC | PRN
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
## 2017-09-21 ENCOUNTER — Encounter (HOSPITAL_COMMUNITY): Payer: Self-pay

## 2017-09-21 ENCOUNTER — Ambulatory Visit (HOSPITAL_COMMUNITY): Payer: Medicare Other | Admitting: Licensed Clinical Social Worker

## 2017-10-13 ENCOUNTER — Other Ambulatory Visit (HOSPITAL_COMMUNITY): Payer: Self-pay | Admitting: Psychiatry

## 2017-10-14 NOTE — Telephone Encounter (Signed)
Medication refill- [facility] Gabapentin. Per Dr. Gilmore LarocheAkhtar, refill is authorize for Gabapentin 100mg , #90. Rx was sent to pharmacy. Pt's next apt is schedule on 10/24/17. Lvm informing of refill status. Nothing further is need at this time.

## 2017-10-19 ENCOUNTER — Ambulatory Visit (INDEPENDENT_AMBULATORY_CARE_PROVIDER_SITE_OTHER): Payer: Medicare Other | Admitting: Licensed Clinical Social Worker

## 2017-10-19 DIAGNOSIS — F4001 Agoraphobia with panic disorder: Secondary | ICD-10-CM

## 2017-10-19 DIAGNOSIS — F4310 Post-traumatic stress disorder, unspecified: Secondary | ICD-10-CM

## 2017-10-19 DIAGNOSIS — F341 Dysthymic disorder: Secondary | ICD-10-CM | POA: Diagnosis not present

## 2017-10-20 NOTE — Progress Notes (Signed)
   THERAPIST PROGRESS NOTE  Session Time: 1:10pm-2:00pm  Participation Level: Active  Behavioral Response: Casual  Alert  Anxious  Type of Therapy: Individual Therapy  Treatment Goals addressed: reduce frequency and intensity of panic attacks  Interventions: Psycho-ed about dissociation, grounding  Suicidal/Homicidal: Denied both  Therapist Interventions:   Gathered information about significant events and symptoms since last seen in July.  Explored patient's experiences with dissociation.  Explained how dissociation is common among individuals with a history of childhood trauma.  Discussed pros and cons of this coping mechanism.  Introduced an exercise for grounding to the present.    Summary:  Reported she was busier than usual in the past few weeks.  She attended her brother's wedding and her daughter's baby shower.  At both events she managed to tolerate being there for about an hour.  Described experiencing some dissociation.  She said "I felt like I was watching my life from outside.  It was almost like I was drunk."  She had people coming up to her asking if she was OK.  She lied and said she was fine. Admitted dissociative episodes are not uncommon for her.  When reviewing a list of different forms of dissociation she endorsed most of them.  Acknowledged that dissociating prevents her from experiencing distressing feelings in the moment but she misses out on enjoying things in the present.   Cooperative about doing the grounding exercise.  Admitted that it was hard for her to keep her focus on one thing for an extended period of time.        Plan:  Scheduled to return later this month.  Will expand on understanding dissociation.    Diagnosis:  Panic disorder with agoraphobia PTSD Dysthymia    Darrin LuisSolomon, Derica Leiber A, LCSW 10/19/2017

## 2017-10-24 ENCOUNTER — Ambulatory Visit (HOSPITAL_COMMUNITY): Payer: 59 | Admitting: Psychiatry

## 2017-10-27 ENCOUNTER — Ambulatory Visit (INDEPENDENT_AMBULATORY_CARE_PROVIDER_SITE_OTHER): Payer: Medicare Other | Admitting: Psychiatry

## 2017-10-27 ENCOUNTER — Encounter (HOSPITAL_COMMUNITY): Payer: Self-pay | Admitting: Psychiatry

## 2017-10-27 VITALS — BP 128/82 | HR 94 | Ht 59.5 in | Wt 130.0 lb

## 2017-10-27 DIAGNOSIS — F341 Dysthymic disorder: Secondary | ICD-10-CM

## 2017-10-27 DIAGNOSIS — M797 Fibromyalgia: Secondary | ICD-10-CM | POA: Diagnosis not present

## 2017-10-27 DIAGNOSIS — F4310 Post-traumatic stress disorder, unspecified: Secondary | ICD-10-CM

## 2017-10-27 DIAGNOSIS — F411 Generalized anxiety disorder: Secondary | ICD-10-CM

## 2017-10-27 DIAGNOSIS — F17290 Nicotine dependence, other tobacco product, uncomplicated: Secondary | ICD-10-CM

## 2017-10-27 DIAGNOSIS — F4001 Agoraphobia with panic disorder: Secondary | ICD-10-CM

## 2017-10-27 MED ORDER — GABAPENTIN 100 MG PO CAPS: ORAL_CAPSULE | ORAL | 1 refills | Status: DC

## 2017-10-27 MED ORDER — BUPROPION HCL 100 MG PO TABS: 100.0000 mg | ORAL_TABLET | Freq: Every day | ORAL | 1 refills | Status: DC

## 2017-10-27 MED ORDER — ALPRAZOLAM 0.5 MG PO TABS: 0.5000 mg | ORAL_TABLET | Freq: Every day | ORAL | 0 refills | Status: DC | PRN
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
## 2017-11-09 ENCOUNTER — Ambulatory Visit (HOSPITAL_COMMUNITY): Payer: Medicare Other | Admitting: Licensed Clinical Social Worker

## 2018-01-07 ENCOUNTER — Other Ambulatory Visit (HOSPITAL_COMMUNITY): Payer: Self-pay | Admitting: Psychiatry

## 2018-01-19 ENCOUNTER — Ambulatory Visit (HOSPITAL_COMMUNITY): Payer: Medicare Other | Admitting: Psychiatry

## 2018-01-23 ENCOUNTER — Encounter (HOSPITAL_COMMUNITY): Payer: Self-pay | Admitting: Psychiatry

## 2018-01-23 ENCOUNTER — Ambulatory Visit (INDEPENDENT_AMBULATORY_CARE_PROVIDER_SITE_OTHER): Payer: Medicare Other | Admitting: Psychiatry

## 2018-01-23 VITALS — BP 120/74 | HR 89 | Ht 59.5 in | Wt 129.0 lb

## 2018-01-23 DIAGNOSIS — F341 Dysthymic disorder: Secondary | ICD-10-CM

## 2018-01-23 DIAGNOSIS — F4001 Agoraphobia with panic disorder: Secondary | ICD-10-CM

## 2018-01-23 DIAGNOSIS — M797 Fibromyalgia: Secondary | ICD-10-CM

## 2018-01-23 DIAGNOSIS — F411 Generalized anxiety disorder: Secondary | ICD-10-CM

## 2018-01-23 DIAGNOSIS — F17210 Nicotine dependence, cigarettes, uncomplicated: Secondary | ICD-10-CM

## 2018-01-23 DIAGNOSIS — Z818 Family history of other mental and behavioral disorders: Secondary | ICD-10-CM

## 2018-01-23 DIAGNOSIS — F4310 Post-traumatic stress disorder, unspecified: Secondary | ICD-10-CM

## 2018-01-23 DIAGNOSIS — F329 Major depressive disorder, single episode, unspecified: Secondary | ICD-10-CM

## 2018-01-23 MED ORDER — GABAPENTIN 100 MG PO CAPS: ORAL_CAPSULE | ORAL | 1 refills | Status: DC
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
## 2018-01-31 ENCOUNTER — Ambulatory Visit (HOSPITAL_COMMUNITY): Payer: Medicare Other | Admitting: Licensed Clinical Social Worker

## 2018-02-15 ENCOUNTER — Ambulatory Visit (INDEPENDENT_AMBULATORY_CARE_PROVIDER_SITE_OTHER): Payer: Medicare Other | Admitting: Licensed Clinical Social Worker

## 2018-02-15 ENCOUNTER — Telehealth (HOSPITAL_COMMUNITY): Payer: Self-pay | Admitting: Psychiatry

## 2018-02-15 DIAGNOSIS — F4310 Post-traumatic stress disorder, unspecified: Secondary | ICD-10-CM | POA: Diagnosis not present

## 2018-02-15 DIAGNOSIS — F341 Dysthymic disorder: Secondary | ICD-10-CM | POA: Diagnosis not present

## 2018-02-15 DIAGNOSIS — F4001 Agoraphobia with panic disorder: Secondary | ICD-10-CM | POA: Diagnosis not present

## 2018-02-15 MED ORDER — ALPRAZOLAM 0.5 MG PO TABS: 0.5000 mg | ORAL_TABLET | Freq: Every day | ORAL | 0 refills | Status: DC | PRN

## 2018-02-15 NOTE — Telephone Encounter (Signed)
Printed rx so that Dr. Gilmore LarocheAkhtar can sign and I will fax in the morning. Tried to call but the pharmacy phone line is down. Nothing further at this time.

## 2018-02-15 NOTE — Telephone Encounter (Signed)
Can send or do phone call in if due

## 2018-02-15 NOTE — Telephone Encounter (Signed)
Pt needs refill on xanax sent to [facility] [place]  °
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
## 2018-03-29 ENCOUNTER — Ambulatory Visit (HOSPITAL_COMMUNITY): Payer: Medicare Other | Admitting: Licensed Clinical Social Worker

## 2018-03-30 ENCOUNTER — Ambulatory Visit (HOSPITAL_COMMUNITY): Payer: Medicare Other | Admitting: Licensed Clinical Social Worker

## 2018-04-18 ENCOUNTER — Ambulatory Visit (HOSPITAL_COMMUNITY): Payer: Medicare Other | Admitting: Psychiatry

## 2018-04-20 ENCOUNTER — Ambulatory Visit (INDEPENDENT_AMBULATORY_CARE_PROVIDER_SITE_OTHER): Payer: Medicare Other | Admitting: Psychiatry

## 2018-04-20 ENCOUNTER — Encounter (HOSPITAL_COMMUNITY): Payer: Self-pay | Admitting: Psychiatry

## 2018-04-20 ENCOUNTER — Other Ambulatory Visit: Payer: Self-pay

## 2018-04-20 VITALS — BP 128/88 | HR 88 | Ht 59.5 in | Wt 131.0 lb

## 2018-04-20 DIAGNOSIS — F1099 Alcohol use, unspecified with unspecified alcohol-induced disorder: Secondary | ICD-10-CM | POA: Diagnosis not present

## 2018-04-20 DIAGNOSIS — M797 Fibromyalgia: Secondary | ICD-10-CM | POA: Diagnosis not present

## 2018-04-20 DIAGNOSIS — F4310 Post-traumatic stress disorder, unspecified: Secondary | ICD-10-CM | POA: Diagnosis not present

## 2018-04-20 DIAGNOSIS — F17210 Nicotine dependence, cigarettes, uncomplicated: Secondary | ICD-10-CM | POA: Diagnosis not present

## 2018-04-20 DIAGNOSIS — F411 Generalized anxiety disorder: Secondary | ICD-10-CM | POA: Diagnosis not present

## 2018-04-20 DIAGNOSIS — F4001 Agoraphobia with panic disorder: Secondary | ICD-10-CM | POA: Diagnosis not present

## 2018-04-20 DIAGNOSIS — Z818 Family history of other mental and behavioral disorders: Secondary | ICD-10-CM

## 2018-04-20 DIAGNOSIS — F339 Major depressive disorder, recurrent, unspecified: Secondary | ICD-10-CM

## 2018-04-20 DIAGNOSIS — Z91410 Personal history of adult physical and sexual abuse: Secondary | ICD-10-CM | POA: Diagnosis not present

## 2018-04-20 DIAGNOSIS — F341 Dysthymic disorder: Secondary | ICD-10-CM

## 2018-04-20 MED ORDER — GABAPENTIN 100 MG PO CAPS: ORAL_CAPSULE | ORAL | 1 refills | Status: DC

## 2018-04-20 MED ORDER — BUPROPION HCL 100 MG PO TABS: 100.0000 mg | ORAL_TABLET | Freq: Every day | ORAL | 1 refills | Status: DC
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
## 2018-05-19 ENCOUNTER — Telehealth (HOSPITAL_COMMUNITY): Payer: Self-pay

## 2018-05-19 MED ORDER — ALPRAZOLAM 0.5 MG PO TABS: 0.5000 mg | ORAL_TABLET | Freq: Every day | ORAL | 0 refills | Status: DC | PRN

## 2018-05-19 NOTE — Telephone Encounter (Signed)
Xanax refill sent

## 2018-05-19 NOTE — Telephone Encounter (Signed)
Pharmacy sent fax requesting a refill on Xanax 0.5mg . [facility] [place]

## 2018-07-13 ENCOUNTER — Ambulatory Visit (HOSPITAL_COMMUNITY): Payer: Medicare Other | Admitting: Psychiatry

## 2018-07-25 ENCOUNTER — Ambulatory Visit (INDEPENDENT_AMBULATORY_CARE_PROVIDER_SITE_OTHER): Payer: Medicare Other | Admitting: Psychiatry

## 2018-07-25 ENCOUNTER — Other Ambulatory Visit: Payer: Self-pay

## 2018-07-25 ENCOUNTER — Encounter (HOSPITAL_COMMUNITY): Payer: Self-pay | Admitting: Psychiatry

## 2018-07-25 VITALS — BP 140/88 | HR 95 | Ht 59.5 in | Wt 136.0 lb

## 2018-07-25 DIAGNOSIS — F4310 Post-traumatic stress disorder, unspecified: Secondary | ICD-10-CM

## 2018-07-25 DIAGNOSIS — F341 Dysthymic disorder: Secondary | ICD-10-CM | POA: Diagnosis not present

## 2018-07-25 DIAGNOSIS — F411 Generalized anxiety disorder: Secondary | ICD-10-CM | POA: Diagnosis not present

## 2018-07-25 DIAGNOSIS — M797 Fibromyalgia: Secondary | ICD-10-CM | POA: Diagnosis not present
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
## 2018-08-23 ENCOUNTER — Other Ambulatory Visit (HOSPITAL_COMMUNITY): Payer: Self-pay | Admitting: Psychiatry

## 2018-09-11 ENCOUNTER — Other Ambulatory Visit (HOSPITAL_COMMUNITY): Payer: Self-pay | Admitting: Psychiatry

## 2018-10-16 ENCOUNTER — Other Ambulatory Visit (HOSPITAL_COMMUNITY): Payer: Self-pay | Admitting: Psychiatry

## 2018-10-26 ENCOUNTER — Ambulatory Visit (INDEPENDENT_AMBULATORY_CARE_PROVIDER_SITE_OTHER): Payer: Medicare Other | Admitting: Psychiatry

## 2018-10-26 ENCOUNTER — Encounter (HOSPITAL_COMMUNITY): Payer: Self-pay | Admitting: Psychiatry

## 2018-10-26 ENCOUNTER — Other Ambulatory Visit: Payer: Self-pay

## 2018-10-26 VITALS — BP 126/80 | HR 85 | Ht 59.5 in | Wt 136.0 lb

## 2018-10-26 DIAGNOSIS — M797 Fibromyalgia: Secondary | ICD-10-CM

## 2018-10-26 DIAGNOSIS — F411 Generalized anxiety disorder: Secondary | ICD-10-CM

## 2018-10-26 DIAGNOSIS — F4310 Post-traumatic stress disorder, unspecified: Secondary | ICD-10-CM

## 2018-10-26 MED ORDER — GABAPENTIN 100 MG PO CAPS: ORAL_CAPSULE | ORAL | 2 refills | Status: DC

## 2018-10-26 NOTE — Progress Notes (Signed)
Patient ID: Martha Roberts, female   DOB: [birth year], 57 y.o.   MRN: [id number]  [facility] Outpatient Follow up visit   Patient Identification: Martha Roberts MRN:  [id number] Date of Evaluation:  10/26/2018 Referral Source: Dr. Tresa Endo , Primary care Chief Complaint:   Chief Complaint    Follow-up; Other     Visit Diagnosis:    ICD-10-CM   1. PTSD (post-traumatic stress disorder) F43.10   2. Fibromyalgia M79.7   3. GAD (generalized anxiety disorder) F41.1     History of Present Illness:  Patient is 57 years old single Caucasian female initially referred by her primary care physician for management of anxiety and depression.  Doing fair. Subdued at times. Stays home. Kids visit her History of panic attacks but better  prefers to stay home   Pain effects mood and sleep.  Takes prn xanax for anxiety  Not hopeless.    Panic and anxiety: when outside Daytime fatiuge same  Modifying factor : daughter   Previous Psychotropic Medications: Yes    Family Psychiatric History: sister, father. Has anxiety and panic attacks  Family History:  Family History  Problem Relation Age of Onset  . Anxiety disorder Father   . Anxiety disorder Sister   . Anxiety disorder Brother     Social History:   Social History   Socioeconomic History  . Marital status: Divorced    Spouse name: Not on file  . Number of children: Not on file  . Years of education: Not on file  . Highest education level: Not on file  Occupational History  . Not on file  Social Needs  . Financial resource strain: Not on file  . Food insecurity:    Worry: Not on file    Inability: Not on file  . Transportation needs:    Medical: Not on file    Non-medical: Not on file  Tobacco Use  . Smoking status: Current Every Day Smoker    Packs/day: 1.00    Years: 15.00    Pack years: 15.00    Types: Cigarettes  . Smokeless tobacco: Never Used  . Tobacco comment: patch, e-cigarettes  Substance and Sexual Activity  .  Alcohol use: No    Alcohol/week: 1.0 standard drinks    Types: 1 Glasses of wine per week  . Drug use: No  . Sexual activity: Never  Lifestyle  . Physical activity:    Days per week: Not on file    Minutes per session: Not on file  . Stress: Not on file  Relationships  . Social connections:    Talks on phone: Not on file    Gets together: Not on file    Attends religious service: Not on file    Active member of club or organization: Not on file    Attends meetings of clubs or organizations: Not on file    Relationship status: Not on file  Other Topics Concern  . Not on file  Social History Narrative  . Not on file    Allergies:   Allergies  Allergen Reactions  . Moxifloxacin Hives  . Sulfa Antibiotics Hives  . Amoxicillin-Pot Clavulanate Nausea And Vomiting    Metabolic Disorder Labs: No results found for: HGBA1C, MPG No results found for: PROLACTIN No results found for: CHOL, TRIG, HDL, CHOLHDL, VLDL, LDLCALC   Current Medications: Current Outpatient Medications  Medication Sig Dispense Refill  . ALPRAZolam (XANAX) 0.5 MG tablet TAKE 1 TABLET(0.5 MG) BY MOUTH DAILY AS NEEDED FOR ANXIETY 30  tablet 0  . buPROPion (WELLBUTRIN) 100 MG tablet TAKE 1 TABLET(100 MG) BY MOUTH DAILY 90 tablet 0  . cephALEXin (KEFLEX) 500 MG capsule   0  . cyclobenzaprine (FLEXERIL) 10 MG tablet Take by mouth daily as needed.     . fluticasone (FLONASE) 50 MCG/ACT nasal spray Place into the nose.    . gabapentin (NEURONTIN) 100 MG capsule TAKE 1 CAPSULE(100 MG) BY MOUTH THREE TIMES DAILY 90 capsule 2  . levocetirizine (XYZAL) 5 MG tablet   0  . omeprazole (PRILOSEC) 20 MG capsule TK 1 C PO QD  11  . ranitidine (ZANTAC) 150 MG tablet TK 1 T PO ONCE A DAY HS 5 DAYS A WEEK  3  . SYMBICORT 160-4.5 MCG/ACT inhaler INL 2 PFS PO BID  6  . tiZANidine (ZANAFLEX) 2 MG tablet TK 1 TO 2 TS PO HS PRN  5  . atenolol (TENORMIN) 25 MG tablet Take by mouth.     No current facility-administered medications  for this visit.       Psychiatric Specialty Exam: Review of Systems  Constitutional: Negative for fever.  Cardiovascular: Negative for chest pain.  Musculoskeletal: Positive for myalgias.  Skin: Negative for itching.  Neurological: Negative for tremors and headaches.  Psychiatric/Behavioral: Negative for depression, substance abuse and suicidal ideas.    Blood pressure 126/80, pulse 85, height 4' 11.5" (1.511 m), weight 136 lb (61.7 kg).Body mass index is 27.01 kg/m.  General Appearance: Casual  Eye Contact:  Fair  Speech:  Normal Rate  Volume:  Decreased  Mood: fair  Affect:  congruent  Thought Process:  Coherent  Orientation:  Full (Time, Place, and Person)  Thought Content: clear  Suicidal Thoughts:  No  Homicidal Thoughts:  No  Memory:  Immediate;   Fair Recent;   Fair  Judgement:  Fair  Insight:  fair  Psychomotor Activity:  Normal  Concentration:  Concentration: Fair and Attention Span: Fair  Recall:  [employer]  Fund of Knowledge:Fair  Language: Fair  Akathisia:  Negative  Handed:  Right  AIMS (if indicated):    Assets:  Communication Skills Desire for Improvement Social Support  ADL's:  Intact  Cognition: WNL  Sleep:  fair    Treatment Plan Summary: Plan as folows   1. Depression:  Fair. Not worse. Continue wellbutrin 2. Anxiety: at times worse. Takes prn xanax then. Gabapentin once or twice  call back for refills 3. Ptsd:  Baseline. Continue to work on coping skills No change in meds.will refill meds. Can call for xanax Fu [phone] Thresa RossNadeem Won Kreuzer, MD 11/14/20191:09 PM

## 2018-12-15 ENCOUNTER — Telehealth (HOSPITAL_COMMUNITY): Payer: Self-pay

## 2018-12-15 MED ORDER — ALPRAZOLAM 0.5 MG PO TABS: ORAL_TABLET | ORAL | 0 refills | Status: DC

## 2018-12-15 NOTE — Telephone Encounter (Signed)
Patient needs refill on Alprazolam sent to [facility] in [place]. Last refill was 09/11/18

## 2018-12-15 NOTE — Telephone Encounter (Signed)
Xanax sent

## 2019-01-14 ENCOUNTER — Other Ambulatory Visit (HOSPITAL_COMMUNITY): Payer: Self-pay | Admitting: Psychiatry

## 2019-02-22 ENCOUNTER — Ambulatory Visit (INDEPENDENT_AMBULATORY_CARE_PROVIDER_SITE_OTHER): Payer: Medicare Other | Admitting: Psychiatry

## 2019-02-22 ENCOUNTER — Encounter (HOSPITAL_COMMUNITY): Payer: Self-pay | Admitting: Psychiatry

## 2019-02-22 ENCOUNTER — Other Ambulatory Visit: Payer: Self-pay

## 2019-02-22 VITALS — BP 120/82 | HR 97 | Ht 59.5 in | Wt 137.0 lb

## 2019-02-22 DIAGNOSIS — F4310 Post-traumatic stress disorder, unspecified: Secondary | ICD-10-CM

## 2019-02-22 DIAGNOSIS — M797 Fibromyalgia: Secondary | ICD-10-CM | POA: Diagnosis not present

## 2019-02-22 DIAGNOSIS — F411 Generalized anxiety disorder: Secondary | ICD-10-CM

## 2019-02-22 DIAGNOSIS — Z79899 Other long term (current) drug therapy: Secondary | ICD-10-CM

## 2019-02-22 MED ORDER — ALPRAZOLAM 0.5 MG PO TABS: ORAL_TABLET | ORAL | 0 refills | Status: DC

## 2019-02-22 MED ORDER — GABAPENTIN 100 MG PO CAPS: ORAL_CAPSULE | ORAL | 2 refills | Status: DC

## 2019-02-22 NOTE — Progress Notes (Signed)
Patient ID: Martha Roberts, female   DOB: 06-06-1961, 58 y.o.   MRN: [id number]  [facility] Outpatient Follow up visit   Patient Identification: Martha Roberts MRN:  [id number] Date of Evaluation:  02/22/2019 Referral Source: Dr. Tresa Endo , Primary care Chief Complaint:   Chief Complaint    Follow-up; Other     Visit Diagnosis:    ICD-10-CM   1. PTSD (post-traumatic stress disorder) F43.10   2. Fibromyalgia M79.7   3. GAD (generalized anxiety disorder) F41.1     History of Present Illness:  Patient is 58 years old single Caucasian female initially referred by her primary care physician for management of anxiety and depression.  Doing fair tolerating medication somewhat stressed at times mostly stays home panic attacks with controlled by medication and also as needed Xanax   Not hopeless.    Panic and anxiety: when outside Daytime fatiuge same  Modifying factor : daughter   Previous Psychotropic Medications: Yes    Family Psychiatric History: sister, father. Has anxiety and panic attacks  Family History:  Family History  Problem Relation Age of Onset  . Anxiety disorder Father   . Anxiety disorder Sister   . Anxiety disorder Brother     Social History:   Social History   Socioeconomic History  . Marital status: Divorced    Spouse name: Not on file  . Number of children: Not on file  . Years of education: Not on file  . Highest education level: Not on file  Occupational History  . Not on file  Social Needs  . Financial resource strain: Not on file  . Food insecurity:    Worry: Not on file    Inability: Not on file  . Transportation needs:    Medical: Not on file    Non-medical: Not on file  Tobacco Use  . Smoking status: Current Every Day Smoker    Packs/day: 1.00    Years: 15.00    Pack years: 15.00    Types: Cigarettes  . Smokeless tobacco: Never Used  . Tobacco comment: patch, e-cigarettes  Substance and Sexual Activity  . Alcohol use: No    Alcohol/week:  1.0 standard drinks    Types: 1 Glasses of wine per week  . Drug use: No  . Sexual activity: Never  Lifestyle  . Physical activity:    Days per week: Not on file    Minutes per session: Not on file  . Stress: Not on file  Relationships  . Social connections:    Talks on phone: Not on file    Gets together: Not on file    Attends religious service: Not on file    Active member of club or organization: Not on file    Attends meetings of clubs or organizations: Not on file    Relationship status: Not on file  Other Topics Concern  . Not on file  Social History Narrative  . Not on file    Allergies:   Allergies  Allergen Reactions  . Moxifloxacin Hives  . Sulfa Antibiotics Hives  . Amoxicillin-Pot Clavulanate Nausea And Vomiting    Metabolic Disorder Labs: No results found for: HGBA1C, MPG No results found for: PROLACTIN No results found for: CHOL, TRIG, HDL, CHOLHDL, VLDL, LDLCALC   Current Medications: Current Outpatient Medications  Medication Sig Dispense Refill  . ALPRAZolam (XANAX) 0.5 MG tablet TAKE 1 TABLET(0.5 MG) BY MOUTH DAILY AS NEEDED FOR ANXIETY 30 tablet 0  . buPROPion (WELLBUTRIN) 100 MG tablet TAKE 1  TABLET(100 MG) BY MOUTH DAILY 90 tablet 0  . cephALEXin (KEFLEX) 500 MG capsule   0  . cyclobenzaprine (FLEXERIL) 10 MG tablet Take by mouth daily as needed.     . fluticasone (FLONASE) 50 MCG/ACT nasal spray Place into the nose.    . gabapentin (NEURONTIN) 100 MG capsule TAKE 1 CAPSULE(100 MG) BY MOUTH THREE TIMES DAILY 90 capsule 2  . levocetirizine (XYZAL) 5 MG tablet   0  . omeprazole (PRILOSEC) 20 MG capsule TK 1 C PO QD  11  . ranitidine (ZANTAC) 150 MG tablet TK 1 T PO ONCE A DAY HS 5 DAYS A WEEK  3  . SYMBICORT 160-4.5 MCG/ACT inhaler INL 2 PFS PO BID  6  . tiZANidine (ZANAFLEX) 2 MG tablet TK 1 TO 2 TS PO HS PRN  5  . atenolol (TENORMIN) 25 MG tablet Take by mouth.     No current facility-administered medications for this visit.        Psychiatric Specialty Exam: Review of Systems  Constitutional: Negative for fever.  Cardiovascular: Negative for palpitations.  Musculoskeletal: Positive for myalgias.  Skin: Negative for itching.  Neurological: Negative for tremors and headaches.  Psychiatric/Behavioral: Negative for depression, substance abuse and suicidal ideas.    Blood pressure 120/82, pulse 97, height 4' 11.5" (1.511 m), weight 137 lb (62.1 kg).Body mass index is 27.21 kg/m.  General Appearance: Casual  Eye Contact:  Fair  Speech:  Normal Rate  Volume:  Decreased  Mood: fair  Affect:  congruent  Thought Process:  Coherent  Orientation:  Full (Time, Place, and Person)  Thought Content: clear  Suicidal Thoughts:  No  Homicidal Thoughts:  No  Memory:  Immediate;   Fair Recent;   Fair  Judgement:  Fair  Insight:  fair  Psychomotor Activity:  Normal  Concentration:  Concentration: Fair and Attention Span: Fair  Recall:  [employer] of Knowledge:Fair  Language: Fair  Akathisia:  Negative  Handed:  Right  AIMS (if indicated):    Assets:  Communication Skills Desire for Improvement Social Support  ADL's:  Intact  Cognition: WNL  Sleep:  fair    Treatment Plan Summary: Plan as folows   1. Depression:  Fair. Continue wellbutrin 2. Anxiety: manageable on meds and xanax prn 3. Ptsd:  Baseline. Continue to work on coping skills No change in meds. Fu 3-80m Thresa Ross, MD 3/12/20201:30 PM

## 2019-04-16 ENCOUNTER — Other Ambulatory Visit (HOSPITAL_COMMUNITY): Payer: Self-pay

## 2019-04-16 MED ORDER — BUPROPION HCL 100 MG PO TABS: ORAL_TABLET | ORAL | 0 refills | Status: DC

## 2019-06-14 ENCOUNTER — Encounter (HOSPITAL_COMMUNITY): Payer: Self-pay | Admitting: Psychiatry

## 2019-06-14 ENCOUNTER — Ambulatory Visit (INDEPENDENT_AMBULATORY_CARE_PROVIDER_SITE_OTHER): Payer: Medicare Other | Admitting: Psychiatry

## 2019-06-14 DIAGNOSIS — F411 Generalized anxiety disorder: Secondary | ICD-10-CM | POA: Diagnosis not present

## 2019-06-14 DIAGNOSIS — M797 Fibromyalgia: Secondary | ICD-10-CM

## 2019-06-14 DIAGNOSIS — F341 Dysthymic disorder: Secondary | ICD-10-CM

## 2019-06-14 DIAGNOSIS — F4310 Post-traumatic stress disorder, unspecified: Secondary | ICD-10-CM | POA: Diagnosis not present

## 2019-06-14 MED ORDER — BUPROPION HCL 100 MG PO TABS: ORAL_TABLET | ORAL | 0 refills | Status: DC

## 2019-06-14 MED ORDER — ALPRAZOLAM 0.5 MG PO TABS: ORAL_TABLET | ORAL | 0 refills | Status: DC

## 2019-06-14 MED ORDER — GABAPENTIN 100 MG PO CAPS: ORAL_CAPSULE | ORAL | 2 refills | Status: DC

## 2019-06-14 NOTE — Progress Notes (Signed)
Patient ID: Martha SchlichterLisa Roberts, female   DOB: 12/07/1961, 58 y.o.   MRN: [id number]  [facility]   Patient Identification: Martha Roberts MRN:  [id number] Date of Evaluation:  06/14/2019 Referral Source: Dr. Tresa EndoKelly , Primary care Chief Complaint:   depression and anxiety follow up Visit Diagnosis:    ICD-10-CM   1. GAD (generalized anxiety disorder)  F41.1   2. Dysthymia  F34.1   3. PTSD (post-traumatic stress disorder)  F43.10   4. Fibromyalgia  M79.7    I connected with Martha Roberts on 06/14/19 at 11:30 AM EDT by telephone and verified that I am speaking with the correct person using two identifiers.   I discussed the limitations, risks, security and privacy concerns of performing an evaluation and management service by telephone and the availability of in person appointments. I also discussed with the patient that there may be a patient responsible charge related to this service. The patient expressed understanding and agreed to proceed.  History of Present Illness:  Patient is 58 years old single Caucasian female initially referred by her primary care physician for management of anxiety and depression.  Mostly stays at home, anxiety when goes out. Lucila MaineGrandson comes 56 years of age a modifying factor Tolerating meds, says not worse    Not hopeless.    Panic and anxiety: when outside Daytime fatiuge : fluctuates, not worse  Modifying factor : daughter   Previous Psychotropic Medications: Yes    Family Psychiatric History: sister, father. Has anxiety and panic attacks  Family History:  Family History  Problem Relation Age of Onset  . Anxiety disorder Father   . Anxiety disorder Sister   . Anxiety disorder Brother     Social History:   Social History   Socioeconomic History  . Marital status: Divorced    Spouse name: Not on file  . Number of children: Not on file  . Years of education: Not on file  . Highest education level: Not on file  Occupational  History  . Not on file  Social Needs  . Financial resource strain: Not on file  . Food insecurity    Worry: Not on file    Inability: Not on file  . Transportation needs    Medical: Not on file    Non-medical: Not on file  Tobacco Use  . Smoking status: Current Every Day Smoker    Packs/day: 1.00    Years: 15.00    Pack years: 15.00    Types: Cigarettes  . Smokeless tobacco: Never Used  . Tobacco comment: patch, e-cigarettes  Substance and Sexual Activity  . Alcohol use: No    Alcohol/week: 1.0 standard drinks    Types: 1 Glasses of wine per week  . Drug use: No  . Sexual activity: Never  Lifestyle  . Physical activity    Days per week: Not on file    Minutes per session: Not on file  . Stress: Not on file  Relationships  . Social Musicianconnections    Talks on phone: Not on file    Gets together: Not on file    Attends religious service: Not on file    Active member of club or organization: Not on file    Attends meetings of clubs or organizations: Not on file    Relationship status: Not on file  Other Topics Concern  . Not on file  Social History Narrative  . Not on file    Allergies:   Allergies  Allergen  Reactions  . Moxifloxacin Hives  . Sulfa Antibiotics Hives  . Amoxicillin-Pot Clavulanate Nausea And Vomiting    Metabolic Disorder Labs: No results found for: HGBA1C, MPG No results found for: PROLACTIN No results found for: CHOL, TRIG, HDL, CHOLHDL, VLDL, LDLCALC   Current Medications: Current Outpatient Medications  Medication Sig Dispense Refill  . ALPRAZolam (XANAX) 0.5 MG tablet TAKE 1 TABLET(0.5 MG) BY MOUTH DAILY AS NEEDED FOR ANXIETY 30 tablet 0  . atenolol (TENORMIN) 25 MG tablet Take by mouth.    Marland Kitchen buPROPion (WELLBUTRIN) 100 MG tablet TAKE 1 TABLET(100 MG) BY MOUTH DAILY 90 tablet 0  . cephALEXin (KEFLEX) 500 MG capsule   0  . cyclobenzaprine (FLEXERIL) 10 MG tablet Take by mouth daily as needed.     . fluticasone (FLONASE) 50 MCG/ACT nasal  spray Place into the nose.    . gabapentin (NEURONTIN) 100 MG capsule TAKE 1 CAPSULE(100 MG) BY MOUTH THREE TIMES DAILY 90 capsule 2  . levocetirizine (XYZAL) 5 MG tablet   0  . omeprazole (PRILOSEC) 20 MG capsule TK 1 C PO QD  11  . ranitidine (ZANTAC) 150 MG tablet TK 1 T PO ONCE A DAY HS 5 DAYS A WEEK  3  . SYMBICORT 160-4.5 MCG/ACT inhaler INL 2 PFS PO BID  6  . tiZANidine (ZANAFLEX) 2 MG tablet TK 1 TO 2 TS PO HS PRN  5   No current facility-administered medications for this visit.       Psychiatric Specialty Exam: Review of Systems  Constitutional: Negative for fever.  Cardiovascular: Negative for palpitations.  Musculoskeletal: Positive for myalgias.  Skin: Negative for itching.  Neurological: Negative for tremors and headaches.  Psychiatric/Behavioral: Negative for substance abuse and suicidal ideas.    There were no vitals taken for this visit.There is no height or weight on file to calculate BMI.  General Appearance:   Eye Contact:    Speech:  Normal Rate  Volume:  Decreased  Mood: fair  Affect:  congruent  Thought Process:  Coherent  Orientation:  Full (Time, Place, and Person)  Thought Content: clear  Suicidal Thoughts:  No  Homicidal Thoughts:  No  Memory:  Immediate;   Fair Recent;   Fair  Judgement:  Fair  Insight:  fair  Psychomotor Activity:  Normal  Concentration:  Concentration: Fair and Attention Span: Fair  Recall:  [employer] of Knowledge:Fair  Language: Fair  Akathisia:  Negative  Handed:  Right  AIMS (if indicated):    Assets:  Communication Skills Desire for Improvement Social Support  ADL's:  Intact  Cognition: WNL  Sleep:  fair    Treatment Plan Summary: Plan as folows   1. Depression: doing baseline, continue wellbutrin 2. Anxiety: manageable on meds and xanax prn 3. Ptsd:  Baseline. Continue to work on coping skills Discussed to have distraction techniques and also add some outdoor activities I discussed the assessment and  treatment plan with the patient. The patient was provided an opportunity to ask questions and all were answered. The patient agreed with the plan and demonstrated an understanding of the instructions.   The patient was advised to call back or seek an in-person evaluation if the symptoms worsen or if the condition fails to improve as anticipated. No change in meds. Fu 3-66m Merian Capron, MD 7/2/202011:37 AM

## 2019-09-07 ENCOUNTER — Other Ambulatory Visit (HOSPITAL_COMMUNITY): Payer: Self-pay

## 2019-09-07 MED ORDER — BUPROPION HCL 100 MG PO TABS: ORAL_TABLET | ORAL | 0 refills | Status: DC

## 2019-10-10 ENCOUNTER — Encounter (HOSPITAL_COMMUNITY): Payer: Self-pay | Admitting: Psychiatry

## 2019-10-10 ENCOUNTER — Telehealth (HOSPITAL_COMMUNITY): Payer: Self-pay | Admitting: Psychiatry

## 2019-10-10 ENCOUNTER — Other Ambulatory Visit: Payer: Self-pay

## 2019-10-10 ENCOUNTER — Ambulatory Visit (INDEPENDENT_AMBULATORY_CARE_PROVIDER_SITE_OTHER): Payer: Medicare Other | Admitting: Psychiatry

## 2019-10-10 DIAGNOSIS — F411 Generalized anxiety disorder: Secondary | ICD-10-CM | POA: Diagnosis not present

## 2019-10-10 DIAGNOSIS — F4310 Post-traumatic stress disorder, unspecified: Secondary | ICD-10-CM | POA: Diagnosis not present

## 2019-10-10 DIAGNOSIS — F341 Dysthymic disorder: Secondary | ICD-10-CM | POA: Diagnosis not present

## 2019-10-10 DIAGNOSIS — M797 Fibromyalgia: Secondary | ICD-10-CM

## 2019-10-10 MED ORDER — GABAPENTIN 100 MG PO CAPS: ORAL_CAPSULE | ORAL | 2 refills | Status: DC

## 2019-10-10 MED ORDER — ALPRAZOLAM 0.5 MG PO TABS: ORAL_TABLET | ORAL | 0 refills | Status: DC

## 2019-10-10 NOTE — Progress Notes (Signed)
Patient ID: Martha Roberts, female   DOB: Aug 12, 1961, 58 y.o.   MRN: [id number]  [facility] Outpatient Follow up visit   Patient Identification: Martha Roberts MRN:  [id number] Date of Evaluation:  10/10/2019 Referral Source: Dr. Tresa Endo , Primary care Chief Complaint:   depression and anxiety follow up Visit Diagnosis:    ICD-10-CM   1. GAD (generalized anxiety disorder)  F41.1   2. Dysthymia  F34.1   3. PTSD (post-traumatic stress disorder)  F43.10   4. Fibromyalgia  M79.7     I connected with Martha Roberts on 10/10/19 at  1:15 PM EDT by telephone and verified that I am speaking with the correct person using two identifiers.   I discussed the limitations, risks, security and privacy concerns of performing an evaluation and management service by telephone and the availability of in person appointments. I also discussed with the patient that there may be a patient responsible charge related to this service. The patient expressed understanding and agreed to proceed.  History of Present Illness:  Patient is 58 years old single Caucasian female initially referred by her primary care physician for management of anxiety and depression.  Mostly stays home. Son is finally talking , he called says he may have bipolar which effected his behaviour Grand son visits are good   Not hopeless.    Panic and anxiety: when outside Daytime fatiuge : fluctuates, not worse  Modifying factor : daughter   Previous Psychotropic Medications: Yes    Family Psychiatric History: sister, father. Has anxiety and panic attacks  Family History:  Family History  Problem Relation Age of Onset  . Anxiety disorder Father   . Anxiety disorder Sister   . Anxiety disorder Brother     Social History:   Social History   Socioeconomic History  . Marital status: Divorced    Spouse name: Not on file  . Number of children: Not on file  . Years of education: Not on file  . Highest education level: Not on file   Occupational History  . Not on file  Social Needs  . Financial resource strain: Not on file  . Food insecurity    Worry: Not on file    Inability: Not on file  . Transportation needs    Medical: Not on file    Non-medical: Not on file  Tobacco Use  . Smoking status: Current Every Day Smoker    Packs/day: 1.00    Years: 15.00    Pack years: 15.00    Types: Cigarettes  . Smokeless tobacco: Never Used  . Tobacco comment: patch, e-cigarettes  Substance and Sexual Activity  . Alcohol use: No    Alcohol/week: 1.0 standard drinks    Types: 1 Glasses of wine per week  . Drug use: No  . Sexual activity: Never  Lifestyle  . Physical activity    Days per week: Not on file    Minutes per session: Not on file  . Stress: Not on file  Relationships  . Social Musician on phone: Not on file    Gets together: Not on file    Attends religious service: Not on file    Active member of club or organization: Not on file    Attends meetings of clubs or organizations: Not on file    Relationship status: Not on file  Other Topics Concern  . Not on file  Social History Narrative  . Not on file    Allergies:  Allergies  Allergen Reactions  . Moxifloxacin Hives  . Sulfa Antibiotics Hives  . Amoxicillin-Pot Clavulanate Nausea And Vomiting    Metabolic Disorder Labs: No results found for: HGBA1C, MPG No results found for: PROLACTIN No results found for: CHOL, TRIG, HDL, CHOLHDL, VLDL, LDLCALC   Current Medications: Current Outpatient Medications  Medication Sig Dispense Refill  . ALPRAZolam (XANAX) 0.5 MG tablet TAKE 1 TABLET(0.5 MG) BY MOUTH DAILY AS NEEDED FOR ANXIETY 30 tablet 0  . atenolol (TENORMIN) 25 MG tablet Take by mouth.    Marland Kitchen buPROPion (WELLBUTRIN) 100 MG tablet TAKE 1 TABLET(100 MG) BY MOUTH DAILY 30 tablet 0  . cephALEXin (KEFLEX) 500 MG capsule   0  . cyclobenzaprine (FLEXERIL) 10 MG tablet Take by mouth daily as needed.     . fluticasone (FLONASE) 50  MCG/ACT nasal spray Place into the nose.    . gabapentin (NEURONTIN) 100 MG capsule TAKE 1 CAPSULE(100 MG) BY MOUTH THREE TIMES DAILY 90 capsule 2  . levocetirizine (XYZAL) 5 MG tablet   0  . omeprazole (PRILOSEC) 20 MG capsule TK 1 C PO QD  11  . ranitidine (ZANTAC) 150 MG tablet TK 1 T PO ONCE A DAY HS 5 DAYS A WEEK  3  . SYMBICORT 160-4.5 MCG/ACT inhaler INL 2 PFS PO BID  6  . tiZANidine (ZANAFLEX) 2 MG tablet TK 1 TO 2 TS PO HS PRN  5   No current facility-administered medications for this visit.       Psychiatric Specialty Exam: Review of Systems  Cardiovascular: Negative for palpitations.  Musculoskeletal: Positive for myalgias.  Skin: Negative for itching.  Neurological: Negative for headaches.  Psychiatric/Behavioral: Negative for substance abuse and suicidal ideas.    There were no vitals taken for this visit.There is no height or weight on file to calculate BMI.  General Appearance:   Eye Contact:    Speech:  Normal Rate  Volume:  Decreased  Mood: fair  Affect:  congruent  Thought Process:  Coherent  Orientation:  Full (Time, Place, and Person)  Thought Content: clear  Suicidal Thoughts:  No  Homicidal Thoughts:  No  Memory:  Immediate;   Fair Recent;   Fair  Judgement:  Fair  Insight:  fair  Psychomotor Activity:  Normal  Concentration:  Concentration: Fair and Attention Span: Fair  Recall:  [employer] of Knowledge:Fair  Language: Fair  Akathisia:  Negative  Handed:  Right  AIMS (if indicated):    Assets:  Communication Skills Desire for Improvement Social Support  ADL's:  Intact  Cognition: WNL  Sleep:  fair    Treatment Plan Summary: Plan as folows   1. Depression: doing baseline,continue wellbutrin 2. Anxiety: manageable on meds and xanax prn including gabapentin  3. Ptsd:  Baseline. Continue to work on coping skills Discussed to have distraction techniques and also add some outdoor activities I discussed the assessment and treatment plan  with the patient. The patient was provided an opportunity to ask questions and all were answered. The patient agreed with the plan and demonstrated an understanding of the instructions.   The patient was advised to call back or seek an in-person evaluation if the symptoms worsen or if the condition fails to improve as anticipated. No change in meds. Fu 81m.  Merian Capron, MD 10/28/20201:20 PM

## 2019-10-10 NOTE — Telephone Encounter (Signed)
Pt seen today. Xanax was called into [facility] in [place], and they are out of stock and don't know when they will get it back in.   Can we call in into [facility] on [address] # [phone]

## 2019-10-11 MED ORDER — ALPRAZOLAM 0.5 MG PO TABS: ORAL_TABLET | ORAL | 0 refills | Status: DC

## 2019-10-11 NOTE — Telephone Encounter (Signed)
Controlled substances can not be transferred.

## 2019-10-11 NOTE — Telephone Encounter (Signed)
She can call [facility] and transfer the prescription there

## 2019-10-11 NOTE — Telephone Encounter (Signed)
[place] sent but before letting patient know please delete or cancel xanax sent to other pharmacy

## 2019-10-12 NOTE — Telephone Encounter (Signed)
Medication was discontinued at [facility] in [place]. Nothing further is needed at this time

## 2019-12-18 ENCOUNTER — Telehealth (HOSPITAL_COMMUNITY): Payer: Self-pay | Admitting: Psychiatry

## 2019-12-18 MED ORDER — BUPROPION HCL 100 MG PO TABS: ORAL_TABLET | ORAL | 0 refills | Status: DC

## 2019-12-18 NOTE — Telephone Encounter (Signed)
[place]  [facility] in [place]

## 2019-12-25 ENCOUNTER — Ambulatory Visit (INDEPENDENT_AMBULATORY_CARE_PROVIDER_SITE_OTHER): Payer: Medicare Other | Admitting: Psychiatry

## 2019-12-25 ENCOUNTER — Other Ambulatory Visit: Payer: Self-pay

## 2019-12-25 ENCOUNTER — Encounter (HOSPITAL_COMMUNITY): Payer: Self-pay | Admitting: Psychiatry

## 2019-12-25 DIAGNOSIS — F4310 Post-traumatic stress disorder, unspecified: Secondary | ICD-10-CM

## 2019-12-25 DIAGNOSIS — F411 Generalized anxiety disorder: Secondary | ICD-10-CM

## 2019-12-25 DIAGNOSIS — F341 Dysthymic disorder: Secondary | ICD-10-CM | POA: Diagnosis not present

## 2019-12-25 MED ORDER — GABAPENTIN 100 MG PO CAPS: ORAL_CAPSULE | ORAL | 2 refills | Status: DC

## 2019-12-25 MED ORDER — ALPRAZOLAM 0.5 MG PO TABS: ORAL_TABLET | ORAL | 0 refills | Status: DC

## 2019-12-25 MED ORDER — BUPROPION HCL 100 MG PO TABS: ORAL_TABLET | ORAL | 1 refills | Status: DC

## 2019-12-25 NOTE — Progress Notes (Signed)
Patient ID: Martha Roberts, female   DOB: October 14, 1961, 59 y.o.   MRN: [id number]  [facility] Outpatient Follow up visit   Patient Identification: Martha Roberts MRN:  [id number] Date of Evaluation:  12/25/2019 Referral Source: Dr. Claiborne Billings , Primary care Chief Complaint:   depression and anxiety follow up Visit Diagnosis:    ICD-10-CM   1. GAD (generalized anxiety disorder)  F41.1   2. Dysthymia  F34.1   3. PTSD (post-traumatic stress disorder)  F43.10     I connected with Morley Kos on 12/25/19 at  1:45 PM EST by telephone and verified that I am speaking with the correct person using two identifiers.    I discussed the limitations, risks, security and privacy concerns of performing an evaluation and management service by telephone and the availability of in person appointments. I also discussed with the patient that there may be a patient responsible charge related to this service. The patient expressed understanding and agreed to proceed.  History of Present Illness:  Patient is 59 years old single Caucasian female initially referred by her primary care physician for management of anxiety and depression.  Mostly stays at home, states fibro keeps her tired  meds help . Takes xanax prn otherwise wellbutrin, gabapentin No side effects Son is talking to her now Grandson visit helps her   Not hopeless.    Panic and anxiety: when outside Daytime fatiuge : fluctuates, not worse  Modifying factor : daughter  Previous Psychotropic Medications: Yes    Family Psychiatric History: sister, father. Has anxiety and panic attacks  Family History:  Family History  Problem Relation Age of Onset  . Anxiety disorder Father   . Anxiety disorder Sister   . Anxiety disorder Brother     Social History:   Social History   Socioeconomic History  . Marital status: Divorced    Spouse name: Not on file  . Number of children: Not on file  . Years of education: Not on file  . Highest education  level: Not on file  Occupational History  . Not on file  Tobacco Use  . Smoking status: Current Every Day Smoker    Packs/day: 1.00    Years: 15.00    Pack years: 15.00    Types: Cigarettes  . Smokeless tobacco: Never Used  . Tobacco comment: patch, e-cigarettes  Substance and Sexual Activity  . Alcohol use: No    Alcohol/week: 1.0 standard drinks    Types: 1 Glasses of wine per week  . Drug use: No  . Sexual activity: Never  Other Topics Concern  . Not on file  Social History Narrative  . Not on file   Social Determinants of Health   Financial Resource Strain:   . Difficulty of Paying Living Expenses: Not on file  Food Insecurity:   . Worried About Charity fundraiser in the Last Year: Not on file  . Ran Out of Food in the Last Year: Not on file  Transportation Needs:   . Lack of Transportation (Medical): Not on file  . Lack of Transportation (Non-Medical): Not on file  Physical Activity:   . Days of Exercise per Week: Not on file  . Minutes of Exercise per Session: Not on file  Stress:   . Feeling of Stress : Not on file  Social Connections:   . Frequency of Communication with Friends and Family: Not on file  . Frequency of Social Gatherings with Friends and Family: Not on file  . Attends Religious  Services: Not on file  . Active Member of Clubs or Organizations: Not on file  . Attends Banker Meetings: Not on file  . Marital Status: Not on file    Allergies:   Allergies  Allergen Reactions  . Moxifloxacin Hives  . Sulfa Antibiotics Hives  . Amoxicillin-Pot Clavulanate Nausea And Vomiting    Metabolic Disorder Labs: No results found for: HGBA1C, MPG No results found for: PROLACTIN No results found for: CHOL, TRIG, HDL, CHOLHDL, VLDL, LDLCALC   Current Medications: Current Outpatient Medications  Medication Sig Dispense Refill  . ALPRAZolam (XANAX) 0.5 MG tablet TAKE 1 TABLET(0.5 MG) BY MOUTH DAILY AS NEEDED FOR ANXIETY 30 tablet 0  .  atenolol (TENORMIN) 25 MG tablet Take by mouth.    Marland Kitchen buPROPion (WELLBUTRIN) 100 MG tablet TAKE 1 TABLET(100 MG) BY MOUTH DAILY 30 tablet 1  . cephALEXin (KEFLEX) 500 MG capsule   0  . cyclobenzaprine (FLEXERIL) 10 MG tablet Take by mouth daily as needed.     . fluticasone (FLONASE) 50 MCG/ACT nasal spray Place into the nose.    . gabapentin (NEURONTIN) 100 MG capsule TAKE 1 CAPSULE(100 MG) BY MOUTH THREE TIMES DAILY 90 capsule 2  . levocetirizine (XYZAL) 5 MG tablet   0  . omeprazole (PRILOSEC) 20 MG capsule TK 1 C PO QD  11  . ranitidine (ZANTAC) 150 MG tablet TK 1 T PO ONCE A DAY HS 5 DAYS A WEEK  3  . SYMBICORT 160-4.5 MCG/ACT inhaler INL 2 PFS PO BID  6  . tiZANidine (ZANAFLEX) 2 MG tablet TK 1 TO 2 TS PO HS PRN  5   No current facility-administered medications for this visit.      Psychiatric Specialty Exam: Review of Systems  Cardiovascular: Negative for palpitations.  Musculoskeletal: Positive for myalgias.  Skin: Negative for itching.  Psychiatric/Behavioral: Negative for substance abuse and suicidal ideas.    There were no vitals taken for this visit.There is no height or weight on file to calculate BMI.  General Appearance:   Eye Contact:    Speech:  Normal Rate  Volume:  Decreased  Mood: fair  Affect:  congruent  Thought Process:  Coherent  Orientation:  Full (Time, Place, and Person)  Thought Content: clear  Suicidal Thoughts:  No  Homicidal Thoughts:  No  Memory:  Immediate;   Fair Recent;   Fair  Judgement:  Fair  Insight:  fair  Psychomotor Activity:  Normal  Concentration:  Concentration: Fair and Attention Span: Fair  Recall:  [employer] of Knowledge:Fair  Language: Fair  Akathisia:  Negative  Handed:  Right  AIMS (if indicated):    Assets:  Communication Skills Desire for Improvement Social Support  ADL's:  Intact  Cognition: WNL  Sleep:  fair    Treatment Plan Summary: Plan as folows   1. Depression: baseline, continue wellbutrin 2.  Anxiety: manageable on meds and xanax prn including gabapentin  3. Ptsd:  Baseline. Continue to work on coping skills Discussed to have distraction techniques and also add some outdoor activities I discussed the assessment and treatment plan with the patient. The patient was provided an opportunity to ask questions and all were answered. The patient agreed with the plan and demonstrated an understanding of the instructions.   The patient was advised to call back or seek an in-person evaluation if the symptoms worsen or if the condition fails to improve as anticipated. No change in meds. Fu 2-17m.  Thresa Ross, MD  1/12/202111:50 AM

## 2020-01-29 ENCOUNTER — Other Ambulatory Visit (HOSPITAL_COMMUNITY): Payer: Self-pay

## 2020-01-29 MED ORDER — BUPROPION HCL 100 MG PO TABS: ORAL_TABLET | ORAL | 0 refills | Status: DC

## 2020-03-25 ENCOUNTER — Encounter (HOSPITAL_COMMUNITY): Payer: Self-pay | Admitting: Psychiatry

## 2020-03-25 ENCOUNTER — Ambulatory Visit (INDEPENDENT_AMBULATORY_CARE_PROVIDER_SITE_OTHER): Payer: Medicare Other | Admitting: Psychiatry

## 2020-03-25 DIAGNOSIS — F4310 Post-traumatic stress disorder, unspecified: Secondary | ICD-10-CM

## 2020-03-25 DIAGNOSIS — M797 Fibromyalgia: Secondary | ICD-10-CM

## 2020-03-25 DIAGNOSIS — F411 Generalized anxiety disorder: Secondary | ICD-10-CM | POA: Diagnosis not present

## 2020-03-25 DIAGNOSIS — F341 Dysthymic disorder: Secondary | ICD-10-CM | POA: Diagnosis not present

## 2020-03-25 MED ORDER — GABAPENTIN 100 MG PO CAPS: ORAL_CAPSULE | ORAL | 0 refills | Status: DC

## 2020-03-25 NOTE — Progress Notes (Signed)
Patient ID: Martha Roberts, female   DOB: May 20, 1961, 59 y.o.   MRN: [id number]  [facility] Outpatient Follow up visit   Patient Identification: Martha Roberts MRN:  [id number] Date of Evaluation:  03/25/2020 Referral Source: Dr. Tresa Endo , Primary care Chief Complaint:    depression follow up  Visit Diagnosis:    ICD-10-CM   1. GAD (generalized anxiety disorder)  F41.1   2. Dysthymia  F34.1   3. PTSD (post-traumatic stress disorder)  F43.10   4. Fibromyalgia  M79.7      I connected with [name] on 03/25/20 at 10:30 AM EDT by telephone and verified that I am speaking with the correct person using two identifiers.    I discussed the limitations, risks, security and privacy concerns of performing an evaluation and management service by telephone and the availability of in person appointments. I also discussed with the patient that there may be a patient responsible charge related to this service. The patient expressed understanding and agreed to proceed.  History of Present Illness:  Patient is 59  years old single Caucasian female initially referred by her primary care physician for management of anxiety and depression.  Mostly stays at home, now going to garden in spring Fibro or fatigue effects her mood at times. Taking gaba but felt dizzy so now only on one a day  Son is talking to her now Grandson visit helps her   Not hopeless.    Panic and anxiety: when outside Daytime fatiuge : fluctuates, not worse  Modifying factor : daugther, family  Previous Psychotropic Medications: Yes    Family Psychiatric History: sister, father. Has anxiety and panic attacks  Family History:  Family History  Problem Relation Age of Onset  . Anxiety disorder Father   . Anxiety disorder Sister   . Anxiety disorder Brother     Social History:   Social History   Socioeconomic History  . Marital status: Divorced    Spouse name: Not on file  . Number of children: Not on file  . Years of  education: Not on file  . Highest education level: Not on file  Occupational History  . Not on file  Tobacco Use  . Smoking status: Current Every Day Smoker    Packs/day: 1.00    Years: 15.00    Pack years: 15.00    Types: Cigarettes  . Smokeless tobacco: Never Used  . Tobacco comment: patch, e-cigarettes  Substance and Sexual Activity  . Alcohol use: No    Alcohol/week: 1.0 standard drinks    Types: 1 Glasses of wine per week  . Drug use: No  . Sexual activity: Never  Other Topics Concern  . Not on file  Social History Narrative  . Not on file   Social Determinants of Health   Financial Resource Strain:   . Difficulty of Paying Living Expenses:   Food Insecurity:   . Worried About Programme researcher, broadcasting/film/video in the Last Year:   . Barista in the Last Year:   Transportation Needs:   . Freight forwarder (Medical):   Marland Kitchen Lack of Transportation (Non-Medical):   Physical Activity:   . Days of Exercise per Week:   . Minutes of Exercise per Session:   Stress:   . Feeling of Stress :   Social Connections:   . Frequency of Communication with Friends and Family:   . Frequency of Social Gatherings with Friends and Family:   . Attends Religious Services:   .  Active Member of Clubs or Organizations:   . Attends Banker Meetings:   Marland Kitchen Marital Status:     Allergies:   Allergies  Allergen Reactions  . Moxifloxacin Hives  . Sulfa Antibiotics Hives  . Amoxicillin-Pot Clavulanate Nausea And Vomiting    Metabolic Disorder Labs: No results found for: HGBA1C, MPG No results found for: PROLACTIN No results found for: CHOL, TRIG, HDL, CHOLHDL, VLDL, LDLCALC   Current Medications: Current Outpatient Medications  Medication Sig Dispense Refill  . ALPRAZolam (XANAX) 0.5 MG tablet TAKE 1 TABLET(0.5 MG) BY MOUTH DAILY AS NEEDED FOR ANXIETY 30 tablet 0  . atenolol (TENORMIN) 25 MG tablet Take by mouth.    Marland Kitchen buPROPion (WELLBUTRIN) 100 MG tablet TAKE 1 TABLET(100 MG)  BY MOUTH DAILY 90 tablet 0  . cephALEXin (KEFLEX) 500 MG capsule   0  . cyclobenzaprine (FLEXERIL) 10 MG tablet Take by mouth daily as needed.     . fluticasone (FLONASE) 50 MCG/ACT nasal spray Place into the nose.    . gabapentin (NEURONTIN) 100 MG capsule Take one a day if needed or regular 30 capsule 0  . levocetirizine (XYZAL) 5 MG tablet   0  . omeprazole (PRILOSEC) 20 MG capsule TK 1 C PO QD  11  . ranitidine (ZANTAC) 150 MG tablet TK 1 T PO ONCE A DAY HS 5 DAYS A WEEK  3  . SYMBICORT 160-4.5 MCG/ACT inhaler INL 2 PFS PO BID  6  . tiZANidine (ZANAFLEX) 2 MG tablet TK 1 TO 2 TS PO HS PRN  5   No current facility-administered medications for this visit.      Psychiatric Specialty Exam: Review of Systems  Cardiovascular: Negative for palpitations.  Musculoskeletal: Positive for myalgias.  Skin: Negative for itching.  Psychiatric/Behavioral: Negative for substance abuse and suicidal ideas.    There were no vitals taken for this visit.There is no height or weight on file to calculate BMI.  General Appearance:   Eye Contact:    Speech:  Normal Rate  Volume:  Decreased  Mood: fair  Affect:  congruent  Thought Process:  Coherent  Orientation:  Full (Time, Place, and Person)  Thought Content: clear  Suicidal Thoughts:  No  Homicidal Thoughts:  No  Memory:  Immediate;   Fair Recent;   Fair  Judgement:  Fair  Insight:  fair  Psychomotor Activity:  Normal  Concentration:  Concentration: Fair and Attention Span: Fair  Recall:  [employer] of Knowledge:Fair  Language: Fair  Akathisia:  Negative  Handed:  Right  AIMS (if indicated):    Assets:  Communication Skills Desire for Improvement Social Support  ADL's:  Intact  Cognition: WNL  Sleep:  fair    Treatment Plan Summary: Plan as folows   1. Depression:  Not worsel conitnue wellbutrin 2. Anxiety: can get stressed but xanax and gaba helps, taking low dose gaba not , discussed not to combing with xanax if feels  dizzy 3. Ptsd: baseline, continue to work on distractions  Takes xanax prn Discussed to have distraction techniques and also add some outdoor activities I discussed the assessment and treatment plan with the patient. The patient was provided an opportunity to ask questions and all were answered. The patient agreed with the plan and demonstrated an understanding of the instructions.   The patient was advised to call back or seek an in-person evaluation if the symptoms worsen or if the condition fails to improve as anticipated. Non face to face time  spent 88min.  Fu 2-65m.  Merian Capron, MD 4/13/202110:39 AM

## 2020-04-17 ENCOUNTER — Telehealth (HOSPITAL_COMMUNITY): Payer: Self-pay

## 2020-04-17 MED ORDER — ALPRAZOLAM 0.5 MG PO TABS: ORAL_TABLET | ORAL | 0 refills | Status: DC

## 2020-04-17 NOTE — Telephone Encounter (Signed)
Patient needs refill on Alprazolam sent to [facility] in [place]

## 2020-04-17 NOTE — Telephone Encounter (Signed)
sent 

## 2020-05-07 ENCOUNTER — Telehealth (HOSPITAL_COMMUNITY): Payer: Self-pay | Admitting: Psychiatry

## 2020-05-07 MED ORDER — BUPROPION HCL 100 MG PO TABS: ORAL_TABLET | ORAL | 0 refills | Status: DC

## 2020-05-07 NOTE — Telephone Encounter (Signed)
Refill- bupropin 100mg  [facility] [place]

## 2020-05-07 NOTE — Telephone Encounter (Signed)
sent 

## 2020-05-25 DIAGNOSIS — I742 Embolism and thrombosis of arteries of the upper extremities: Secondary | ICD-10-CM | POA: Insufficient documentation

## 2020-05-25 DIAGNOSIS — Z720 Tobacco use: Secondary | ICD-10-CM | POA: Insufficient documentation

## 2020-05-25 DIAGNOSIS — I82611 Acute embolism and thrombosis of superficial veins of right upper extremity: Secondary | ICD-10-CM | POA: Insufficient documentation

## 2020-05-26 DIAGNOSIS — M797 Fibromyalgia: Secondary | ICD-10-CM | POA: Insufficient documentation

## 2020-06-24 ENCOUNTER — Telehealth (INDEPENDENT_AMBULATORY_CARE_PROVIDER_SITE_OTHER): Payer: Medicare Other | Admitting: Psychiatry

## 2020-06-24 ENCOUNTER — Encounter (HOSPITAL_COMMUNITY): Payer: Self-pay | Admitting: Psychiatry

## 2020-06-24 DIAGNOSIS — F341 Dysthymic disorder: Secondary | ICD-10-CM | POA: Diagnosis not present

## 2020-06-24 DIAGNOSIS — F411 Generalized anxiety disorder: Secondary | ICD-10-CM | POA: Diagnosis not present

## 2020-06-24 DIAGNOSIS — F4310 Post-traumatic stress disorder, unspecified: Secondary | ICD-10-CM

## 2020-06-24 MED ORDER — GABAPENTIN 100 MG PO CAPS: ORAL_CAPSULE | ORAL | 0 refills | Status: DC

## 2020-06-24 NOTE — Progress Notes (Signed)
Patient ID: Martha Roberts, female   DOB: 10/12/1961, 59 y.o.   MRN: [id number]  [facility] Outpatient Follow up visit   Patient Identification: Martha Roberts MRN:  [id number] Date of Evaluation:  06/24/2020 Referral Source: Dr. Tresa Endo , Primary care Chief Complaint:    depression follow up  Visit Diagnosis:    ICD-10-CM   1. GAD (generalized anxiety disorder)  F41.1   2. Dysthymia  F34.1   3. PTSD (post-traumatic stress disorder)  F43.10       I connected with [name] on 06/24/20 at  3:30 PM EDT by telephone and verified that I am speaking with the correct person using two identifiers.   I discussed the limitations, risks, security and privacy concerns of performing an evaluation and management service by telephone and the availability of in person appointments. I also discussed with the patient that there may be a patient responsible charge related to this service. The patient expressed understanding and agreed to proceed. Patient location: home Provider location: home  History of Present Illness:  Patient is 59  years old single Caucasian female initially referred by her primary care physician for management of anxiety and depression.  Mostly stays at home, had medical comorbidity effects mood, follows up with providers  Son is talking to her now Grandson visit helps her   Not hopeless.    Panic and anxiety: when outside Daytime fatiuge : fluctuates, not worse  Modifying factor : daugther, family  Previous Psychotropic Medications: Yes    Family Psychiatric History: sister, father. Has anxiety and panic attacks  Family History:  Family History  Problem Relation Age of Onset  . Anxiety disorder Father   . Anxiety disorder Sister   . Anxiety disorder Brother     Social History:   Social History   Socioeconomic History  . Marital status: Divorced    Spouse name: Not on file  . Number of children: Not on file  . Years of education: Not on file  . Highest  education level: Not on file  Occupational History  . Not on file  Tobacco Use  . Smoking status: Current Every Day Smoker    Packs/day: 1.00    Years: 15.00    Pack years: 15.00    Types: Cigarettes  . Smokeless tobacco: Never Used  . Tobacco comment: patch, e-cigarettes  Vaping Use  . Vaping Use: Never used  Substance and Sexual Activity  . Alcohol use: No    Alcohol/week: 1.0 standard drink    Types: 1 Glasses of wine per week  . Drug use: No  . Sexual activity: Never  Other Topics Concern  . Not on file  Social History Narrative  . Not on file   Social Determinants of Health   Financial Resource Strain:   . Difficulty of Paying Living Expenses:   Food Insecurity:   . Worried About Programme researcher, broadcasting/film/video in the Last Year:   . Barista in the Last Year:   Transportation Needs:   . Freight forwarder (Medical):   Marland Kitchen Lack of Transportation (Non-Medical):   Physical Activity:   . Days of Exercise per Week:   . Minutes of Exercise per Session:   Stress:   . Feeling of Stress :   Social Connections:   . Frequency of Communication with Friends and Family:   . Frequency of Social Gatherings with Friends and Family:   . Attends Religious Services:   . Active Member of Clubs or Organizations:   .  Attends Banker Meetings:   Marland Kitchen Marital Status:     Allergies:   Allergies  Allergen Reactions  . Moxifloxacin Hives  . Sulfa Antibiotics Hives  . Amoxicillin-Pot Clavulanate Nausea And Vomiting    Metabolic Disorder Labs: No results found for: HGBA1C, MPG No results found for: PROLACTIN No results found for: CHOL, TRIG, HDL, CHOLHDL, VLDL, LDLCALC   Current Medications: Current Outpatient Medications  Medication Sig Dispense Refill  . ALPRAZolam (XANAX) 0.5 MG tablet TAKE 1 TABLET(0.5 MG) BY MOUTH DAILY AS NEEDED FOR ANXIETY 30 tablet 0  . atenolol (TENORMIN) 25 MG tablet Take by mouth.    Marland Kitchen buPROPion (WELLBUTRIN) 100 MG tablet TAKE 1 TABLET(100  MG) BY MOUTH DAILY 90 tablet 0  . cephALEXin (KEFLEX) 500 MG capsule   0  . cyclobenzaprine (FLEXERIL) 10 MG tablet Take by mouth daily as needed.     . fluticasone (FLONASE) 50 MCG/ACT nasal spray Place into the nose.    . gabapentin (NEURONTIN) 100 MG capsule Take one a day if needed or regular 30 capsule 0  . levocetirizine (XYZAL) 5 MG tablet   0  . omeprazole (PRILOSEC) 20 MG capsule TK 1 C PO QD  11  . ranitidine (ZANTAC) 150 MG tablet TK 1 T PO ONCE A DAY HS 5 DAYS A WEEK  3  . SYMBICORT 160-4.5 MCG/ACT inhaler INL 2 PFS PO BID  6  . tiZANidine (ZANAFLEX) 2 MG tablet TK 1 TO 2 TS PO HS PRN  5   No current facility-administered medications for this visit.      Psychiatric Specialty Exam: Review of Systems  Cardiovascular: Negative for palpitations.  Musculoskeletal: Positive for myalgias.  Skin: Negative for itching.  Psychiatric/Behavioral: Negative for substance abuse and suicidal ideas.    There were no vitals taken for this visit.There is no height or weight on file to calculate BMI.  General Appearance:   Eye Contact:    Speech:  Normal Rate  Volume:  Decreased  Mood: fair  Affect:  congruent  Thought Process:  Coherent  Orientation:  Full (Time, Place, and Person)  Thought Content: clear  Suicidal Thoughts:  No  Homicidal Thoughts:  No  Memory:  Immediate;   Fair Recent;   Fair  Judgement:  Fair  Insight:  fair  Psychomotor Activity:  Normal  Concentration:  Concentration: Fair and Attention Span: Fair  Recall:  [employer] of Knowledge:Fair  Language: Fair  Akathisia:  Negative  Handed:  Right  AIMS (if indicated):    Assets:  Communication Skills Desire for Improvement Social Support  ADL's:  Intact  Cognition: WNL  Sleep:  fair    Treatment Plan Summary: Plan as folows   1. Depression:  Manageable, continue wellbutrin 2. Anxiety:worries about health, dwells on worries, continue gabapentin work on distractions .take xanax prn  3. Ptsd:  baseline, continue to work on distractions   Discussed to have distraction techniques and also add some outdoor activities I discussed the assessment and treatment plan with the patient. The patient was provided an opportunity to ask questions and all were answered. The patient agreed with the plan and demonstrated an understanding of the instructions.   The patient was advised to call back or seek an in-person evaluation if the symptoms worsen or if the condition fails to improve as anticipated. Non face to face time spent .  Fu 2-30m.  Thresa Ross, MD 7/13/20213:39 PM

## 2020-07-16 ENCOUNTER — Other Ambulatory Visit (HOSPITAL_COMMUNITY): Payer: Self-pay | Admitting: Psychiatry

## 2020-07-28 ENCOUNTER — Other Ambulatory Visit (HOSPITAL_COMMUNITY): Payer: Self-pay | Admitting: Psychiatry

## 2020-09-25 ENCOUNTER — Telehealth (INDEPENDENT_AMBULATORY_CARE_PROVIDER_SITE_OTHER): Payer: Medicare Other | Admitting: Psychiatry

## 2020-09-25 ENCOUNTER — Encounter (HOSPITAL_COMMUNITY): Payer: Self-pay | Admitting: Psychiatry

## 2020-09-25 DIAGNOSIS — F341 Dysthymic disorder: Secondary | ICD-10-CM

## 2020-09-25 DIAGNOSIS — F4310 Post-traumatic stress disorder, unspecified: Secondary | ICD-10-CM

## 2020-09-25 DIAGNOSIS — F411 Generalized anxiety disorder: Secondary | ICD-10-CM | POA: Diagnosis not present

## 2020-09-25 NOTE — Progress Notes (Signed)
Patient ID: Martha Roberts, female   DOB: 12-Sep-1961, 59 y.o.   MRN: [id number]  [facility] Outpatient Follow up visit   Patient Identification: Martha Roberts MRN:  [id number] Date of Evaluation:  09/25/2020 Referral Source: Dr. Tresa Endo , Primary care Chief Complaint:    depression follow up  Visit Diagnosis:    ICD-10-CM   1. GAD (generalized anxiety disorder)  F41.1   2. Dysthymia  F34.1   3. PTSD (post-traumatic stress disorder)  F43.10       I connected with Martha Roberts on 09/25/20 at  3:30 PM EDT by telephone and verified that I am speaking with the correct person using two identifiers.    I discussed the limitations, risks, security and privacy concerns of performing an evaluation and management service by telephone and the availability of in person appointments. I also discussed with the patient that there may be a patient responsible charge related to this service. The patient expressed understanding and agreed to proceed. Patient location: home Provider location: home office  History of Present Illness:  Patient is 59  years old single Caucasian female initially referred by her primary care physician for management of anxiety and depression.  Mostly stays at home, had medical comorbidity effects mood, follows up with providers  Grandson visits help her Martha Roberts out in the garden during sunny days help Tolerating meds and they help keep some balance   Not hopeless.    Panic and anxiety: when outside Daytime fatiuge :fluctuates, not worse  Modifying factor : daugther, family Previous Psychotropic Medications: Yes    Family Psychiatric History: sister, father. Has anxiety and panic attacks  Family History:  Family History  Problem Relation Age of Onset  . Anxiety disorder Father   . Anxiety disorder Sister   . Anxiety disorder Brother     Social History:   Social History   Socioeconomic History  . Marital status: Divorced    Spouse name: Not on file  . Number of  children: Not on file  . Years of education: Not on file  . Highest education level: Not on file  Occupational History  . Not on file  Tobacco Use  . Smoking status: Current Every Day Smoker    Packs/day: 1.00    Years: 15.00    Pack years: 15.00    Types: Cigarettes  . Smokeless tobacco: Never Used  . Tobacco comment: patch, e-cigarettes  Vaping Use  . Vaping Use: Never used  Substance and Sexual Activity  . Alcohol use: No    Alcohol/week: 1.0 standard drink    Types: 1 Glasses of wine per week  . Drug use: No  . Sexual activity: Never  Other Topics Concern  . Not on file  Social History Narrative  . Not on file   Social Determinants of Health   Financial Resource Strain:   . Difficulty of Paying Living Expenses: Not on file  Food Insecurity:   . Worried About Programme researcher, broadcasting/film/video in the Last Year: Not on file  . Ran Out of Food in the Last Year: Not on file  Transportation Needs:   . Lack of Transportation (Medical): Not on file  . Lack of Transportation (Non-Medical): Not on file  Physical Activity:   . Days of Exercise per Week: Not on file  . Minutes of Exercise per Session: Not on file  Stress:   . Feeling of Stress : Not on file  Social Connections:   . Frequency of Communication with Friends and  Family: Not on file  . Frequency of Social Gatherings with Friends and Family: Not on file  . Attends Religious Services: Not on file  . Active Member of Clubs or Organizations: Not on file  . Attends Banker Meetings: Not on file  . Marital Status: Not on file    Allergies:   Allergies  Allergen Reactions  . Moxifloxacin Hives  . Sulfa Antibiotics Hives  . Amoxicillin-Pot Clavulanate Nausea And Vomiting    Metabolic Disorder Labs: No results found for: HGBA1C, MPG No results found for: PROLACTIN No results found for: CHOL, TRIG, HDL, CHOLHDL, VLDL, LDLCALC   Current Medications: Current Outpatient Medications  Medication Sig Dispense  Refill  . ALPRAZolam (XANAX) 0.5 MG tablet TAKE 1 TABLET(0.5 MG) BY MOUTH DAILY AS NEEDED FOR ANXIETY 30 tablet 0  . atenolol (TENORMIN) 25 MG tablet Take by mouth.    Marland Kitchen buPROPion (WELLBUTRIN) 100 MG tablet TAKE 1 TABLET(100 MG) BY MOUTH DAILY 90 tablet 0  . cephALEXin (KEFLEX) 500 MG capsule   0  . cyclobenzaprine (FLEXERIL) 10 MG tablet Take by mouth daily as needed.     . fluticasone (FLONASE) 50 MCG/ACT nasal spray Place into the nose.    . gabapentin (NEURONTIN) 100 MG capsule Take one a day if needed or regular 30 capsule 0  . levocetirizine (XYZAL) 5 MG tablet   0  . omeprazole (PRILOSEC) 20 MG capsule TK 1 C PO QD  11  . ranitidine (ZANTAC) 150 MG tablet TK 1 T PO ONCE A DAY HS 5 DAYS A WEEK  3  . SYMBICORT 160-4.5 MCG/ACT inhaler INL 2 PFS PO BID  6  . tiZANidine (ZANAFLEX) 2 MG tablet TK 1 TO 2 TS PO HS PRN  5   No current facility-administered medications for this visit.      Psychiatric Specialty Exam: Review of Systems  Cardiovascular: Negative for palpitations.  Musculoskeletal: Positive for myalgias.  Skin: Negative for itching.  Psychiatric/Behavioral: Negative for substance abuse and suicidal ideas.    There were no vitals taken for this visit.There is no height or weight on file to calculate BMI.  General Appearance:   Eye Contact:    Speech:  Normal Rate  Volume:  Decreased  Mood: fair  Affect:  congruent  Thought Process:  Coherent  Orientation:  Full (Time, Place, and Person)  Thought Content: clear  Suicidal Thoughts:  No  Homicidal Thoughts:  No  Memory:  Immediate;   Fair Recent;   Fair  Judgement:  Fair  Insight:  fair  Psychomotor Activity:  Normal  Concentration:  Concentration: Fair and Attention Span: Fair  Recall:  [employer] of Knowledge:Fair  Language: Fair  Akathisia:  Negative  Handed:  Right  AIMS (if indicated):    Assets:  Communication Skills Desire for Improvement Social Support  ADL's:  Intact  Cognition: WNL  Sleep:  fair     Treatment Plan Summary: Plan as folows   1. Depression:  Doing fair, continue wellbutrin  Feels meds are doing what they can 2. Anxiety:worries about health, dwells on worries, continue gabapentin work on distractions .take xanax prn  3. Ptsd: baseline, continue to work on distractions   Discussed to have distraction techniques and also add some outdoor activities I discussed the assessment and treatment plan with the patient. The patient was provided an opportunity to ask questions and all were answered. The patient agreed with the plan and demonstrated an understanding of the instructions.  The patient was advised to call back or seek an in-person evaluation if the symptoms worsen or if the condition fails to improve as anticipated. Non face to face time spent 15 min Fu 2-78m.  Thresa Ross, MD 10/14/20213:43 PM

## 2020-10-31 ENCOUNTER — Telehealth (HOSPITAL_COMMUNITY): Payer: Self-pay | Admitting: Psychiatry

## 2020-10-31 MED ORDER — ALPRAZOLAM 0.5 MG PO TABS: ORAL_TABLET | ORAL | 0 refills | Status: DC

## 2020-10-31 MED ORDER — BUPROPION HCL 100 MG PO TABS: ORAL_TABLET | ORAL | 0 refills | Status: DC

## 2020-10-31 NOTE — Telephone Encounter (Signed)
sent 

## 2020-10-31 NOTE — Telephone Encounter (Signed)
Pt needs refill on wellbutrin and xanax [facility] [place]

## 2020-11-01 ENCOUNTER — Other Ambulatory Visit (HOSPITAL_COMMUNITY): Payer: Self-pay | Admitting: Psychiatry

## 2020-12-25 ENCOUNTER — Encounter (HOSPITAL_COMMUNITY): Payer: Self-pay | Admitting: Psychiatry

## 2020-12-25 ENCOUNTER — Other Ambulatory Visit: Payer: Self-pay

## 2020-12-25 ENCOUNTER — Telehealth (INDEPENDENT_AMBULATORY_CARE_PROVIDER_SITE_OTHER): Payer: Medicaid Other | Admitting: Psychiatry

## 2020-12-25 DIAGNOSIS — F411 Generalized anxiety disorder: Secondary | ICD-10-CM

## 2020-12-25 DIAGNOSIS — F341 Dysthymic disorder: Secondary | ICD-10-CM

## 2020-12-25 NOTE — Progress Notes (Signed)
Patient ID: Martha Roberts, female   DOB: 01-May-1961, 60 y.o.   MRN: [id number]  [facility] Outpatient Follow up visit   Patient Identification: Rashida Ladouceur MRN:  [id number] Date of Evaluation:  12/25/2020 Referral Source: Dr. Tresa Endo , Primary care Chief Complaint:    depression follow up  Visit Diagnosis:    ICD-10-CM   1. GAD (generalized anxiety disorder)  F41.1   2. Dysthymia  F34.1       I connected with [name] on 12/25/20 at 10:30 AM EST by telephone and verified that I am speaking with the correct person using two identifiers.    I discussed the limitations, risks, security and privacy concerns of performing an evaluation and management service by telephone and the availability of in person appointments. I also discussed with the patient that there may be a patient responsible charge related to this service. The patient expressed understanding and agreed to proceed. Patient location: home Provider location: home office  History of Present Illness:  Patient is 60  years old single Caucasian female initially referred by her primary care physician for management of anxiety and depression.  Mostly stays at home, had medical comorbidity effects mood, follows up with providers  Has daughter calls her, grandson visits her, otherwise lives by herself and tries to keep busy  Tolerating meds and they help keep some balance   Not hopeless.    Panic and anxiety: around crowds Daytime fatiuge :fluctuates, not worse  Modifying factor : family Previous Psychotropic Medications: Yes    Family Psychiatric History: sister, father. Has anxiety and panic attacks  Family History:  Family History  Problem Relation Age of Onset  . Anxiety disorder Father   . Anxiety disorder Sister   . Anxiety disorder Brother     Social History:   Social History   Socioeconomic History  . Marital status: Divorced    Spouse name: Not on file  . Number of children: Not on file  . Years of  education: Not on file  . Highest education level: Not on file  Occupational History  . Not on file  Tobacco Use  . Smoking status: Current Every Day Smoker    Packs/day: 1.00    Years: 15.00    Pack years: 15.00    Types: Cigarettes  . Smokeless tobacco: Never Used  . Tobacco comment: patch, e-cigarettes  Vaping Use  . Vaping Use: Never used  Substance and Sexual Activity  . Alcohol use: No    Alcohol/week: 1.0 standard drink    Types: 1 Glasses of wine per week  . Drug use: No  . Sexual activity: Never  Other Topics Concern  . Not on file  Social History Narrative  . Not on file   Social Determinants of Health   Financial Resource Strain: Not on file  Food Insecurity: Not on file  Transportation Needs: Not on file  Physical Activity: Not on file  Stress: Not on file  Social Connections: Not on file    Allergies:   Allergies  Allergen Reactions  . Moxifloxacin Hives  . Sulfa Antibiotics Hives  . Amoxicillin-Pot Clavulanate Nausea And Vomiting    Metabolic Disorder Labs: No results found for: HGBA1C, MPG No results found for: PROLACTIN No results found for: CHOL, TRIG, HDL, CHOLHDL, VLDL, LDLCALC   Current Medications: Current Outpatient Medications  Medication Sig Dispense Refill  . ALPRAZolam (XANAX) 0.5 MG tablet Qd prn 30 tablet 0  . atenolol (TENORMIN) 25 MG tablet Take by mouth.    Marland Kitchen  buPROPion (WELLBUTRIN) 100 MG tablet One a day 90 tablet 0  . cephALEXin (KEFLEX) 500 MG capsule   0  . cyclobenzaprine (FLEXERIL) 10 MG tablet Take by mouth daily as needed.     . fluticasone (FLONASE) 50 MCG/ACT nasal spray Place into the nose.    . gabapentin (NEURONTIN) 100 MG capsule Take one a day if needed or regular 30 capsule 0  . levocetirizine (XYZAL) 5 MG tablet   0  . omeprazole (PRILOSEC) 20 MG capsule TK 1 C PO QD  11  . ranitidine (ZANTAC) 150 MG tablet TK 1 T PO ONCE A DAY HS 5 DAYS A WEEK  3  . SYMBICORT 160-4.5 MCG/ACT inhaler INL 2 PFS PO BID  6  .  tiZANidine (ZANAFLEX) 2 MG tablet TK 1 TO 2 TS PO HS PRN  5   No current facility-administered medications for this visit.      Psychiatric Specialty Exam: Review of Systems  Cardiovascular: Negative for chest pain.  Musculoskeletal: Positive for myalgias.  Skin: Negative for itching.  Psychiatric/Behavioral: Negative for substance abuse and suicidal ideas.    There were no vitals taken for this visit.There is no height or weight on file to calculate BMI.  General Appearance:   Eye Contact:    Speech:  Normal Rate  Volume:  Decreased  Mood: fair  Affect:  congruent  Thought Process:  Coherent  Orientation:  Full (Time, Place, and Person)  Thought Content: clear  Suicidal Thoughts:  No  Homicidal Thoughts:  No  Memory:  Immediate;   Fair Recent;   Fair  Judgement:  Fair  Insight:  fair  Psychomotor Activity:  Normal  Concentration:  Concentration: Fair and Attention Span: Fair  Recall:  [employer] of Knowledge:Fair  Language: Fair  Akathisia:  Negative  Handed:  Right  AIMS (if indicated):    Assets:  Communication Skills Desire for Improvement Social Support  ADL's:  Intact  Cognition: WNL  Sleep:  fair    Treatment Plan Summary: Plan as folows   1. Depression:  Manageable, continue wellbutrin 2. Anxiety: fluctuates, tries to distract, continue xanax prn and gabapentin. 3. Ptsd: baseline, continue to work on distractions   Discussed to have distraction techniques and also add some outdoor activities I discussed the assessment and treatment plan with the patient. The patient was provided an opportunity to ask questions and all were answered. The patient agreed with the plan and demonstrated an understanding of the instructions.   The patient was advised to call back or seek an in-person evaluation if the symptoms worsen or if the condition fails to improve as anticipated. Non face to face time spent 15 min Fu 2-66m.  Thresa Ross, MD 1/13/202210:44 AM

## 2021-02-01 ENCOUNTER — Other Ambulatory Visit (HOSPITAL_COMMUNITY): Payer: Self-pay | Admitting: Psychiatry

## 2021-03-22 ENCOUNTER — Other Ambulatory Visit (HOSPITAL_COMMUNITY): Payer: Self-pay | Admitting: Psychiatry

## 2021-03-26 ENCOUNTER — Telehealth (INDEPENDENT_AMBULATORY_CARE_PROVIDER_SITE_OTHER): Payer: Medicaid Other | Admitting: Psychiatry

## 2021-03-26 ENCOUNTER — Encounter (HOSPITAL_COMMUNITY): Payer: Self-pay | Admitting: Psychiatry

## 2021-03-26 DIAGNOSIS — F411 Generalized anxiety disorder: Secondary | ICD-10-CM | POA: Diagnosis not present

## 2021-03-26 DIAGNOSIS — F341 Dysthymic disorder: Secondary | ICD-10-CM

## 2021-03-26 NOTE — Progress Notes (Signed)
Patient ID: Martha Roberts, female   DOB: 1961-01-07, 60 y.o.   MRN: [id number]  [facility] Outpatient Follow up visit   Patient Identification: Kirsi Hugh MRN:  [id number] Date of Evaluation:  03/26/2021 Referral Source: Dr. Tresa Endo , Primary care Chief Complaint:    depression follow up  Visit Diagnosis:    ICD-10-CM   1. GAD (generalized anxiety disorder)  F41.1   2. Dysthymia  F34.1    Virtual Visit via Telephone Note  I connected with [name] on 03/26/21 at 10:30 AM EDT by telephone and verified that I am speaking with the correct person using two identifiers.  Location: Patient: home Provider: office   I discussed the limitations, risks, security and privacy concerns of performing an evaluation and management service by telephone and the availability of in person appointments. I also discussed with the patient that there may be a patient responsible charge related to this service. The patient expressed understanding and agreed to proceed.    I discussed the assessment and treatment plan with the patient. The patient was provided an opportunity to ask questions and all were answered. The patient agreed with the plan and demonstrated an understanding of the instructions.   The patient was advised to call back or seek an in-person evaluation if the symptoms worsen or if the condition fails to improve as anticipated.  I provided 14  minutes of non-face-to-face time during this encounter including documentation     History of Present Illness:   Patient doing fair, tries to do gardening to keep busy Has daughter calls her, grandson visits her, otherwise lives by herself   Tolerating meds and they help keep some balance   Not hopeless.  Takes xanax prn but gaba tid for anxiety  Panic and anxiety: around crowds   Modifying factor : family Previous Psychotropic Medications: Yes    Family Psychiatric History: sister, father. Has anxiety and panic attacks  Family History:   Family History  Problem Relation Age of Onset  . Anxiety disorder Father   . Anxiety disorder Sister   . Anxiety disorder Brother     Social History:   Social History   Socioeconomic History  . Marital status: Divorced    Spouse name: Not on file  . Number of children: Not on file  . Years of education: Not on file  . Highest education level: Not on file  Occupational History  . Not on file  Tobacco Use  . Smoking status: Current Every Day Smoker    Packs/day: 1.00    Years: 15.00    Pack years: 15.00    Types: Cigarettes  . Smokeless tobacco: Never Used  . Tobacco comment: patch, e-cigarettes  Vaping Use  . Vaping Use: Never used  Substance and Sexual Activity  . Alcohol use: No    Alcohol/week: 1.0 standard drink    Types: 1 Glasses of wine per week  . Drug use: No  . Sexual activity: Never  Other Topics Concern  . Not on file  Social History Narrative  . Not on file   Social Determinants of Health   Financial Resource Strain: Not on file  Food Insecurity: Not on file  Transportation Needs: Not on file  Physical Activity: Not on file  Stress: Not on file  Social Connections: Not on file    Allergies:   Allergies  Allergen Reactions  . Moxifloxacin Hives  . Sulfa Antibiotics Hives  . Amoxicillin-Pot Clavulanate Nausea And Vomiting    Metabolic Disorder  Labs: No results found for: HGBA1C, MPG No results found for: PROLACTIN No results found for: CHOL, TRIG, HDL, CHOLHDL, VLDL, LDLCALC   Current Medications: Current Outpatient Medications  Medication Sig Dispense Refill  . ALPRAZolam (XANAX) 0.5 MG tablet TAKE 1 TABLET(0.5 MG) BY MOUTH DAILY AS NEEDED FOR ANXIETY 30 tablet 0  . atenolol (TENORMIN) 25 MG tablet Take by mouth.    Marland Kitchen buPROPion (WELLBUTRIN) 100 MG tablet TAKE 1 TABLET BY MOUTH EVERY DAY 90 tablet 0  . cephALEXin (KEFLEX) 500 MG capsule   0  . cyclobenzaprine (FLEXERIL) 10 MG tablet Take by mouth daily as needed.     . fluticasone  (FLONASE) 50 MCG/ACT nasal spray Place into the nose.    . gabapentin (NEURONTIN) 100 MG capsule TAKE 1 CAPSULE(100 MG) BY MOUTH THREE TIMES DAILY 90 capsule 0  . levocetirizine (XYZAL) 5 MG tablet   0  . omeprazole (PRILOSEC) 20 MG capsule TK 1 C PO QD  11  . ranitidine (ZANTAC) 150 MG tablet TK 1 T PO ONCE A DAY HS 5 DAYS A WEEK  3  . SYMBICORT 160-4.5 MCG/ACT inhaler INL 2 PFS PO BID  6  . tiZANidine (ZANAFLEX) 2 MG tablet TK 1 TO 2 TS PO HS PRN  5   No current facility-administered medications for this visit.      Psychiatric Specialty Exam: Review of Systems  Cardiovascular: Negative for chest pain.  Musculoskeletal: Positive for myalgias.  Psychiatric/Behavioral: Negative for substance abuse and suicidal ideas.    There were no vitals taken for this visit.There is no height or weight on file to calculate BMI.  General Appearance:   Eye Contact:    Speech:  Normal Rate  Volume:  Decreased  Mood: fair  Affect:  congruent  Thought Process:  Coherent  Orientation:  Full (Time, Place, and Person)  Thought Content: clear  Suicidal Thoughts:  No  Homicidal Thoughts:  No  Memory:  Immediate;   Fair Recent;   Fair  Judgement:  Fair  Insight:  fair  Psychomotor Activity:  Normal  Concentration:  Concentration: Fair and Attention Span: Fair  Recall:  [employer] of Knowledge:Fair  Language: Fair  Akathisia:  Negative  Handed:  Right  AIMS (if indicated):    Assets:  Communication Skills Desire for Improvement Social Support  ADL's:  Intact  Cognition: WNL  Sleep:  fair    Treatment Plan Summary: Plan as folows  Prior documentation reviewed  1. Depression: fair, continue wellbutrin 2. Anxiety: tries to distract, gardening helps, continue prn xanax and gaba  3. Ptsd: baseline, continue to work on distractions    Fu 2-83m.  Thresa Ross, MD 4/14/202210:45 AM

## 2021-05-04 ENCOUNTER — Other Ambulatory Visit (HOSPITAL_COMMUNITY): Payer: Self-pay | Admitting: Psychiatry

## 2021-07-02 ENCOUNTER — Telehealth (INDEPENDENT_AMBULATORY_CARE_PROVIDER_SITE_OTHER): Payer: Medicaid Other | Admitting: Psychiatry

## 2021-07-02 ENCOUNTER — Encounter (HOSPITAL_COMMUNITY): Payer: Self-pay | Admitting: Psychiatry

## 2021-07-02 DIAGNOSIS — F341 Dysthymic disorder: Secondary | ICD-10-CM | POA: Diagnosis not present

## 2021-07-02 DIAGNOSIS — F411 Generalized anxiety disorder: Secondary | ICD-10-CM | POA: Diagnosis not present

## 2021-07-02 NOTE — Progress Notes (Signed)
Patient ID: Norita Meigs, female   DOB: September 21, 1961, 60 y.o.   MRN: [id number]  [facility] Outpatient Follow up visit   Patient Identification: Martha Roberts MRN:  [id number] Date of Evaluation:  07/02/2021 Referral Source: Dr. Tresa Endo , Primary care Chief Complaint:    depression follow up  Visit Diagnosis:    ICD-10-CM   1. GAD (generalized anxiety disorder)  F41.1     2. Dysthymia  F34.1      Virtual Visit via Telephone Note  I connected with Martha Roberts on 07/02/21 at  1:30 PM EDT by telephone and verified that I am speaking with the correct person using two identifiers.  Location: Patient: home Provider: office   I discussed the limitations, risks, security and privacy concerns of performing an evaluation and management service by telephone and the availability of in person appointments. I also discussed with the patient that there may be a patient responsible charge related to this service. The patient expressed understanding and agreed to proceed.   History of Present Illness:    Observations/Objective:   Assessment and Plan:   Follow Up Instructions:    I discussed the assessment and treatment plan with the patient. The patient was provided an opportunity to ask questions and all were answered. The patient agreed with the plan and demonstrated an understanding of the instructions.   The patient was advised to call back or seek an in-person evaluation if the symptoms worsen or if the condition fails to improve as anticipated.  I provided 11 minutes of non-face-to-face time during this encounter.     History of Present Illness:   Was doing fair but is recently some increased anxiety her son has broken up with his girlfriend so she is trying to help with her grandson more overall some cramps at times but she does keep her self hydrated  He does not want to adjust more medication take Xanax more regularly these days otherwise she was taking it as needed  She also does  gardening and helps her grandson   Panic and anxiety: Around crowds  Modifying factor : Family previous Psychotropic Medications: Yes    Family Psychiatric History: sister, father. Has anxiety and panic attacks  Family History:  Family History  Problem Relation Age of Onset   Anxiety disorder Father    Anxiety disorder Sister    Anxiety disorder Brother     Social History:   Social History   Socioeconomic History   Marital status: Divorced    Spouse name: Not on file   Number of children: Not on file   Years of education: Not on file   Highest education level: Not on file  Occupational History   Not on file  Tobacco Use   Smoking status: Every Day    Packs/day: 1.00    Years: 15.00    Pack years: 15.00    Types: Cigarettes   Smokeless tobacco: Never   Tobacco comments:    patch, e-cigarettes  Vaping Use   Vaping Use: Never used  Substance and Sexual Activity   Alcohol use: No    Alcohol/week: 1.0 standard drink    Types: 1 Glasses of wine per week   Drug use: No   Sexual activity: Never  Other Topics Concern   Not on file  Social History Narrative   Not on file   Social Determinants of Health   Financial Resource Strain: Not on file  Food Insecurity: Not on file  Transportation Needs: Not on file  Physical Activity: Not on file  Stress: Not on file  Social Connections: Not on file    Allergies:   Allergies  Allergen Reactions   Moxifloxacin Hives   Sulfa Antibiotics Hives   Amoxicillin-Pot Clavulanate Nausea And Vomiting    Metabolic Disorder Labs: No results found for: HGBA1C, MPG No results found for: PROLACTIN No results found for: CHOL, TRIG, HDL, CHOLHDL, VLDL, LDLCALC   Current Medications: Current Outpatient Medications  Medication Sig Dispense Refill   ALPRAZolam (XANAX) 0.5 MG tablet TAKE 1 TABLET(0.5 MG) BY MOUTH DAILY AS NEEDED FOR ANXIETY 30 tablet 0   atenolol (TENORMIN) 25 MG tablet Take by mouth.     buPROPion (WELLBUTRIN)  100 MG tablet TAKE 1 TABLET BY MOUTH EVERY DAY 90 tablet 0   cephALEXin (KEFLEX) 500 MG capsule   0   cyclobenzaprine (FLEXERIL) 10 MG tablet Take by mouth daily as needed.      fluticasone (FLONASE) 50 MCG/ACT nasal spray Place into the nose.     gabapentin (NEURONTIN) 100 MG capsule TAKE 1 CAPSULE(100 MG) BY MOUTH THREE TIMES DAILY 90 capsule 0   levocetirizine (XYZAL) 5 MG tablet   0   omeprazole (PRILOSEC) 20 MG capsule TK 1 C PO QD  11   ranitidine (ZANTAC) 150 MG tablet TK 1 T PO ONCE A DAY HS 5 DAYS A WEEK  3   SYMBICORT 160-4.5 MCG/ACT inhaler INL 2 PFS PO BID  6   tiZANidine (ZANAFLEX) 2 MG tablet TK 1 TO 2 TS PO HS PRN  5   No current facility-administered medications for this visit.      Psychiatric Specialty Exam: Review of Systems  Cardiovascular:  Negative for chest pain.  Musculoskeletal:  Positive for myalgias.  Psychiatric/Behavioral:  Negative for substance abuse and suicidal ideas.    There were no vitals taken for this visit.There is no height or weight on file to calculate BMI.  General Appearance:   Eye Contact:    Speech:  Normal Rate  Volume:  Decreased  Mood: fair  Affect:  congruent  Thought Process:  Coherent  Orientation:  Full (Time, Place, and Person)  Thought Content: clear  Suicidal Thoughts:  No  Homicidal Thoughts:  No  Memory:  Immediate;   Fair Recent;   Fair  Judgement:  Fair  Insight:  fair  Psychomotor Activity:  Normal  Concentration:  Concentration: Fair and Attention Span: Fair  Recall:  [employer] of Knowledge:Fair  Language: Fair  Akathisia:  Negative  Handed:  Right  AIMS (if indicated):    Assets:  Communication Skills Desire for Improvement Social Support  ADL's:  Intact  Cognition: WNL  Sleep:  fair    Treatment Plan Summary: Plan as folows prior documentation reviewed  1. Depression: fair continue Wellbutrin 2. Anxiety: tries to distract, gardening helps, continue prn xanax and gaba #2 generalized anxiety  disorder: Does get anxious she is taking GABA she does not want to increase the dose to take Xanax as needed basis  Call back for concerns earlier 3. Ptsd: baseline, continue to work on distractions    Fu 2-10m.  Thresa Ross, MD 7/21/20221:39 PM

## 2021-07-19 ENCOUNTER — Other Ambulatory Visit: Payer: Self-pay

## 2021-07-19 ENCOUNTER — Encounter: Payer: Self-pay | Admitting: Emergency Medicine

## 2021-07-19 ENCOUNTER — Emergency Department (INDEPENDENT_AMBULATORY_CARE_PROVIDER_SITE_OTHER)
Admission: EM | Admit: 2021-07-19 | Discharge: 2021-07-19 | Disposition: A | Discharge status: Home / Self Care | Payer: Medicare Other | Source: Home / Self Care | Attending: Family Medicine | Admitting: Family Medicine

## 2021-07-19 DIAGNOSIS — F17200 Nicotine dependence, unspecified, uncomplicated: Secondary | ICD-10-CM | POA: Diagnosis not present

## 2021-07-19 DIAGNOSIS — J0111 Acute recurrent frontal sinusitis: Secondary | ICD-10-CM

## 2021-07-19 MED ORDER — AMOXICILLIN 875 MG PO TABS: 875.0000 mg | ORAL_TABLET | Freq: Two times a day (BID) | ORAL | 0 refills | Status: DC

## 2021-07-19 NOTE — ED Triage Notes (Signed)
Patient c/o possible sinus infection, sinus drainage, left ear pain x 5 days.  Patient is taken Affrin and Tylenol together.  Patient is not vaccinated for COVID.  Home COVID tests both negative.

## 2021-07-19 NOTE — ED Provider Notes (Signed)
[facility]    CSN: [id number] Arrival date & time: 07/19/21  1226      History   Chief Complaint Chief Complaint  Patient presents with   Facial Pain    HPI Martha Roberts is a 60 y.o. female.   HPI  Patient states she has recurring sinusitis.  She is a cigarette smoker.  She currently has sinus infection symptoms on the left side of her face.  Its in her left cheek, and around her left eye.  She states the pain is severe.  Is radiating into her left ear.  Its causing a left-sided headache.  She states is getting worse with time.  She states that she is using Afrin and still cannot get the sinuses to drain.  She is using her usual Flonase.  She does have allergy medicines.  No coughing or chest congestion.  No fever or chills.  No headache (generalized) or body aches.  Patient did a COVID test at home that was negative  History reviewed. No pertinent past medical history.  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 No pertinent surgical history.  OB History   No obstetric history on file.      Home Medications    Prior to Admission medications   Medication Sig Start Date End Date Taking? Authorizing Provider  ALPRAZolam (XANAX) 0.5 MG tablet TAKE 1 TABLET(0.5 MG) BY MOUTH DAILY AS NEEDED FOR ANXIETY 05/05/21  Yes Thresa Ross, MD  amoxicillin (AMOXIL) 875 MG tablet Take 1 tablet (875 mg total) by mouth 2 (two) times daily. 07/19/21  Yes Eustace Moore, MD  buPROPion [facility]) 100 MG tablet TAKE 1 TABLET BY MOUTH EVERY DAY 05/04/21  Yes Thresa Ross, MD  cyclobenzaprine (FLEXERIL) 10 MG tablet Take by mouth daily as needed.    Yes [provider]  fluticasone (FLONASE) 50 MCG/ACT nasal spray Place into the  nose.   Yes [provider]  gabapentin (NEURONTIN) 100 MG capsule TAKE 1 CAPSULE(100 MG) BY MOUTH THREE TIMES DAILY 03/24/21  Yes Thresa Ross, MD  omeprazole (PRILOSEC) 20 MG capsule TK 1 C PO QD 02/01/17  Yes [provider]  SYMBICORT 160-4.5 MCG/ACT inhaler INL 2 PFS PO BID 01/30/18  Yes [provider]  atenolol (TENORMIN) 25 MG tablet Take by mouth. 02/04/16 04/14/17  [provider]  amitriptyline (ELAVIL) 10 MG tablet Take 1 tablet (10 mg total) by mouth at bedtime. 02/01/17 02/22/17  Thresa Ross, MD  escitalopram (LEXAPRO) 5 MG tablet Take 1 tablet (5 mg total) by mouth daily. Patient not taking: Reported on 05/19/2016 05/06/16 05/20/16  Thresa Ross, MD  mirtazapine (REMERON) 7.5 MG tablet Take 1 tablet (7.5 mg total) by mouth at bedtime. 01/06/17 02/01/17  Thresa Ross, MD  traZODone (DESYREL) 50 MG tablet TK 1 T PO TID 01/11/18 07/25/18  [provider]  venlafaxine XR (EFFEXOR-XR) 37.5 MG 24 hr capsule Take 1 capsule (37.5 mg total) by mouth daily with breakfast. 11/11/16 01/06/17  Thresa Ross, MD    Family History Family History  Problem Relation Age of Onset   Anxiety disorder Father    Anxiety disorder Sister    Anxiety disorder Brother     Social History Social History   Tobacco Use   Smoking status: Every Day  Packs/day: 1.00    Years: 15.00    Pack years: 15.00    Types: Cigarettes   Smokeless tobacco: Never   Tobacco comments:    patch, e-cigarettes  Vaping Use   Vaping Use: Never used  Substance Use Topics   Alcohol use: No    Alcohol/week: 1.0 standard drink    Types: 1 Glasses of wine per week   Drug use: No     Allergies   Moxifloxacin, Sulfa antibiotics, and Amoxicillin-pot clavulanate   Review of Systems Review of Systems See HPI  Physical Exam Triage Vital Signs ED Triage Vitals  Enc Vitals Group     BP 07/19/21 1310 (!) 132/94     Pulse Rate 07/19/21 1310 95     Resp --      Temp 07/19/21  1310 99.1 F (37.3 C)     Temp Source 07/19/21 1310 Oral     SpO2 07/19/21 1310 97 %     Weight 07/19/21 1312 132 lb (59.9 kg)     Height 07/19/21 1312 4' 11.5" (1.511 m)     Head Circumference --      Peak Flow --      Pain Score 07/19/21 1312 10     Pain Loc --      Pain Edu? --      Excl. in GC? --    No data found.  Updated Vital Signs BP (!) 132/94 (BP Location: Left Arm)   Pulse 95   Temp 99.1 F (37.3 C) (Oral)   Ht 4' 11.5" (1.511 m)   Wt 59.9 kg   SpO2 97%   BMI 26.21 kg/m      Physical Exam Constitutional:      General: She is not in acute distress.    Appearance: She is well-developed. She is ill-appearing.     Comments: Acutely uncomfortable.  Smells of tobacco.  Holding left face  HENT:     Head: Normocephalic and atraumatic.     Right Ear: Tympanic membrane, ear canal and external ear normal.     Left Ear: Tympanic membrane, ear canal and external ear normal.     Nose: Congestion present.     Comments: Nasal membranes congested and red    Mouth/Throat:     Comments: Mouth is dry.  Tongue is brown.  There is tenderness in the maxillary frontal on the left only.  And ethmoid sinuses Eyes:     Conjunctiva/sclera: Conjunctivae normal.     Pupils: Pupils are equal, round, and reactive to light.  Cardiovascular:     Rate and Rhythm: Normal rate and regular rhythm.     Heart sounds: Normal heart sounds.  Pulmonary:     Effort: Pulmonary effort is normal. No respiratory distress.     Breath sounds: Rhonchi present.  Abdominal:     General: There is no distension.     Palpations: Abdomen is soft.  Musculoskeletal:        General: Normal range of motion.     Cervical back: Normal range of motion.  Lymphadenopathy:     Cervical: Cervical adenopathy present.  Skin:    General: Skin is warm and dry.  Neurological:     Mental Status: She is alert.     UC Treatments / Results  Labs (all labs ordered are listed, but only abnormal results are  displayed) Labs Reviewed - No data to display  EKG   Radiology No results found.  Procedures Procedures (including critical care  time)  Medications Ordered in UC Medications - No data to display  Initial Impression / Assessment and Plan / UC Course  I have reviewed the triage vital signs and the nursing notes.  Pertinent labs & imaging results that were available during my care of the patient were reviewed by me and considered in my medical decision making (see chart for details).     I explained to the patient that smoking was a risk factor for recurring sinusitis.  I did advise that she try to quit smoking.  I will treat her sinusitis with amoxicillin.  She states that she is intolerant of Augmentin due to GI distress but she can take amoxicillin fine.  Discussed with her also continuing her Flonase, fluids, and to use Afrin with caution. Final Clinical Impressions(s) / UC Diagnoses   Final diagnoses:  Acute recurrent frontal sinusitis  Tobacco dependence     Discharge Instructions      Continue using your Flonase every day.  This will help with the sinus congestion Drink lots of water.  This helps keep the sinus drainage more liquid Use Afrin for no more than 3 days Use antibiotic 2 times a day for 7 days Follow-up with your primary care doctor if not better by next week   ED Prescriptions     Medication Sig Dispense Auth. Provider   amoxicillin (AMOXIL) 875 MG tablet Take 1 tablet (875 mg total) by mouth 2 (two) times daily. 14 tablet Eustace Moore, MD      PDMP not reviewed this encounter.   Eustace Moore, MD 07/19/21 [phone]

## 2021-07-19 NOTE — Discharge Instructions (Addendum)
Continue using your Flonase every day.  This will help with the sinus congestion Drink lots of water.  This helps keep the sinus drainage more liquid Use Afrin for no more than 3 days Use antibiotic 2 times a day for 7 days Follow-up with your primary care doctor if not better by next week

## 2021-07-21 ENCOUNTER — Encounter: Payer: Self-pay | Admitting: Emergency Medicine

## 2021-07-23 ENCOUNTER — Emergency Department (INDEPENDENT_AMBULATORY_CARE_PROVIDER_SITE_OTHER)
Admission: EM | Admit: 2021-07-23 | Discharge: 2021-07-23 | Disposition: A | Discharge status: Home / Self Care | Payer: Medicare Other | Source: Home / Self Care | Attending: Family Medicine | Admitting: Family Medicine

## 2021-07-23 DIAGNOSIS — J0101 Acute recurrent maxillary sinusitis: Secondary | ICD-10-CM

## 2021-07-23 HISTORY — DX: Fibromyalgia: M79.7

## 2021-07-23 HISTORY — DX: Anxiety disorder, unspecified: F41.9

## 2021-07-23 HISTORY — DX: Depression, unspecified: F32.A

## 2021-07-23 HISTORY — DX: Ventricular premature depolarization: I49.3

## 2021-07-23 MED ORDER — CEFTRIAXONE SODIUM 500 MG IJ SOLR
500.0000 mg | Freq: Once | INTRAMUSCULAR | Status: AC
  Administered 2021-07-23: 500 mg via INTRAMUSCULAR

## 2021-07-23 MED ORDER — PREDNISONE 20 MG PO TABS: 20.0000 mg | ORAL_TABLET | Freq: Two times a day (BID) | ORAL | 0 refills | Status: DC

## 2021-07-23 NOTE — ED Triage Notes (Signed)
Pt presents to Urgent Care with c/o L ear pain and headache x 9 days. Was seen at [facility] on 07/19/21 and prescribed Amoxicillin, but states she has not improved. She is also concerned about the "ringing in the ear that gets louder w/ each heart beat." States she has heart palpitations and chest discomfort when the pain is severe. Denies sob and nausea.

## 2021-07-23 NOTE — ED Provider Notes (Signed)
[name]    CSN: [id number] Arrival date & time: 07/23/21  1856      History   Chief Complaint Chief Complaint  Patient presents with   Otalgia   Headache    HPI Martha Roberts is a 60 y.o. female.   HPI  Patient was seen in 07/19/2021 by myself.  She was treated for acute sinusitis with amoxicillin 875 twice daily.  She states she is taking this as prescribed.  She has been taking it for 4 days.  She states she is still using her Flonase.  Drinking lots of fluids.  Still has pain in her face and in her left ear.  Does not feel very much improved.  Wants to know if she needs a different antibiotic or different treatment.  No coughing or chest congestion.  No fever or chills.  Slight headache left-sided.  No body aches.  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      Home Medications    Prior to Admission medications   Medication Sig Start Date End Date Taking? Authorizing Provider  ibuprofen (ADVIL) 200 MG tablet Take 200 mg by mouth every 6 (six) hours as needed.   Yes [provider]  predniSONE (DELTASONE) 20 MG tablet Take 1 tablet (20 mg total) by mouth 2 (two) times daily with a meal. 07/23/21  Yes Eustace Moore, MD  ALPRAZolam [facility] Feeler) 0.5 MG tablet TAKE 1 TABLET(0.5 MG) BY MOUTH DAILY AS NEEDED FOR ANXIETY 05/05/21   Thresa Ross, MD  amoxicillin (AMOXIL) 875 MG tablet Take 1 tablet (875  mg total) by mouth 2 (two) times daily. 07/19/21   Eustace Moore, MD  atenolol (TENORMIN) 25 MG tablet Take by mouth. 02/04/16 04/14/17  [provider]  buPROPion (WELLBUTRIN) 100 MG tablet TAKE 1 TABLET BY MOUTH EVERY DAY 05/04/21   Thresa Ross, MD  cyclobenzaprine (FLEXERIL) 10 MG tablet Take by mouth daily as needed.     [provider]  fluticasone (FLONASE) 50 MCG/ACT nasal spray Place into the nose.    [provider]  gabapentin (NEURONTIN) 100 MG capsule TAKE 1 CAPSULE(100 MG) BY MOUTH THREE TIMES DAILY 03/24/21   Thresa Ross, MD  omeprazole (PRILOSEC) 20 MG capsule TK 1 C PO QD 02/01/17   [provider]  SYMBICORT 160-4.5 MCG/ACT inhaler INL 2 PFS PO BID 01/30/18   [provider]  amitriptyline (ELAVIL) 10 MG tablet Take 1 tablet (10 mg total) by mouth at bedtime. 02/01/17 02/22/17  Thresa Ross, MD  escitalopram (LEXAPRO) 5 MG tablet Take 1 tablet (5 mg total) by mouth daily. Patient not taking: Reported on 05/19/2016 05/06/16 05/20/16  Thresa Ross, MD  mirtazapine (REMERON) 7.5 MG tablet Take 1 tablet (7.5 mg total) by mouth at bedtime. 01/06/17 02/01/17  Thresa Ross, MD  traZODone (DESYREL) 50 MG tablet TK 1 T PO TID  01/11/18 07/25/18  [provider]  venlafaxine XR (EFFEXOR-XR) 37.5 MG 24 hr capsule Take 1 capsule (37.5 mg total) by mouth daily with breakfast. 11/11/16 01/06/17  Thresa Ross, MD    Family History Family History  Problem Relation Age of Onset   Cancer Mother    Diabetes Father    Anxiety disorder Father    Anxiety disorder Sister    Anxiety disorder Brother     Social History Social History   Tobacco Use   Smoking status: Every Day    Packs/day: 1.00    Years: 15.00    Pack years: 15.00    Types: Cigarettes   Smokeless tobacco: Never   Tobacco comments:    patch, e-cigarettes  Vaping Use   Vaping Use: Never used  Substance Use Topics   Alcohol use: No    Alcohol/week: 1.0 standard drink     Types: 1 Glasses of wine per week   Drug use: No     Allergies   Moxifloxacin, Sulfa antibiotics, and Amoxicillin-pot clavulanate   Review of Systems Review of Systems See HPI  Physical Exam Triage Vital Signs ED Triage Vitals  Enc Vitals Group     BP 07/23/21 1924 136/89     Pulse Rate 07/23/21 1924 79     Resp 07/23/21 1924 20     Temp 07/23/21 1924 99.8 F (37.7 C)     Temp Source 07/23/21 1924 Oral     SpO2 07/23/21 1924 99 %     Weight 07/23/21 1918 132 lb (59.9 kg)     Height 07/23/21 1918 4\' 11"  (1.499 m)     Head Circumference --      Peak Flow --      Pain Score 07/23/21 1918 8     Pain Loc --      Pain Edu? --      Excl. in GC? --    No data found.  Updated Vital Signs BP 136/89 (BP Location: Right Arm)   Pulse 79   Temp 99.8 F (37.7 C) (Oral)   Resp 20   Ht 4\' 11"  (1.499 m)   Wt 59.9 kg   SpO2 99%   BMI 26.66 kg/m     Physical Exam Vitals reviewed.  Constitutional:      General: She is not in acute distress.    Appearance: She is well-developed. She is ill-appearing.  HENT:     Head: Normocephalic and atraumatic.     Right Ear: Tympanic membrane, ear canal and external ear normal.     Left Ear: Tympanic membrane, ear canal and external ear normal.     Nose: Congestion and rhinorrhea present.     Mouth/Throat:     Mouth: Mucous membranes are moist.     Pharynx: Posterior oropharyngeal erythema present.  Eyes:     Conjunctiva/sclera: Conjunctivae normal.     Pupils: Pupils are equal, round, and reactive to light.  Cardiovascular:     Rate and Rhythm: Normal rate and regular rhythm.     Heart sounds: Normal heart sounds.  Pulmonary:     Effort: Pulmonary effort is normal. No respiratory distress.     Breath sounds: Rhonchi present.  Abdominal:     General: There is no distension.     Palpations: Abdomen is soft.  Musculoskeletal:        General: Normal range of motion.     Cervical back: Normal range of motion.  Lymphadenopathy:      Cervical:  Cervical adenopathy present.  Skin:    General: Skin is warm and dry.  Neurological:     Mental Status: She is alert.   Smells of tobacco  UC Treatments / Results  Labs (all labs ordered are listed, but only abnormal results are displayed) Labs Reviewed - No data to display  EKG   Radiology No results found.  Procedures Procedures (including critical care time)  Medications Ordered in UC Medications  cefTRIAXone (ROCEPHIN) injection 500 mg (has no administration in time range)    Initial Impression / Assessment and Plan / UC Course  I have reviewed the triage vital signs and the nursing notes.  Pertinent labs & imaging results that were available during my care of the patient were reviewed by me and considered in my medical decision making (see chart for details).     Going to add prednisone 20 twice daily for 5 days.  Continue Flonase and fluids.  Finish amoxicillin.  Add a shot of Rocephin.  See PCP if not improving by next week Final Clinical Impressions(s) / UC Diagnoses   Final diagnoses:  Acute recurrent maxillary sinusitis     Discharge Instructions      Continue the antibiotic until gone Take prednisone 2 times a day for 5 days Continue with Flonase Drink lots of water    ED Prescriptions     Medication Sig Dispense Auth. Provider   predniSONE (DELTASONE) 20 MG tablet Take 1 tablet (20 mg total) by mouth 2 (two) times daily with a meal. 10 tablet Eustace Moore, MD      PDMP not reviewed this encounter.   Eustace Moore, MD 07/23/21 [phone]

## 2021-07-23 NOTE — Discharge Instructions (Addendum)
Continue the antibiotic until gone Take prednisone 2 times a day for 5 days Continue with Flonase Drink lots of water

## 2021-08-13 ENCOUNTER — Other Ambulatory Visit (HOSPITAL_COMMUNITY): Payer: Self-pay | Admitting: Psychiatry

## 2021-08-14 ENCOUNTER — Other Ambulatory Visit (HOSPITAL_COMMUNITY): Payer: Self-pay | Admitting: Psychiatry

## 2021-08-28 ENCOUNTER — Encounter (HOSPITAL_COMMUNITY): Payer: Self-pay | Admitting: Psychiatry

## 2021-08-28 ENCOUNTER — Telehealth (INDEPENDENT_AMBULATORY_CARE_PROVIDER_SITE_OTHER): Payer: Medicare Other | Admitting: Psychiatry

## 2021-08-28 DIAGNOSIS — F341 Dysthymic disorder: Secondary | ICD-10-CM

## 2021-08-28 DIAGNOSIS — F411 Generalized anxiety disorder: Secondary | ICD-10-CM

## 2021-08-28 MED ORDER — ALPRAZOLAM 0.5 MG PO TABS: ORAL_TABLET | ORAL | 0 refills | Status: DC

## 2021-08-28 NOTE — Progress Notes (Signed)
Patient ID: TULEEN MANDELBAUM, female   DOB: [birth year], 60 y.o.   MRN: [id number]  [facility] Outpatient Follow up visit   Patient Identification: Martha Roberts MRN:  [id number] Date of Evaluation:  08/28/2021 Referral Source: Dr. Tresa Endo , Primary care Chief Complaint:    depression follow up  Visit Diagnosis:    ICD-10-CM   1. GAD (generalized anxiety disorder)  F41.1     2. Dysthymia  F34.1      Virtual Visit via Telephone Note  I connected with Martha Roberts on 08/28/21 at 10:00 AM EDT by telephone and verified that I am speaking with the correct person using two identifiers.  Location: Patient: home Provider: home office   I discussed the limitations, risks, security and privacy concerns of performing an evaluation and management service by telephone and the availability of in person appointments. I also discussed with the patient that there may be a patient responsible charge related to this service. The patient expressed understanding and agreed to proceed.      I discussed the assessment and treatment plan with the patient. The patient was provided an opportunity to ask questions and all were answered. The patient agreed with the plan and demonstrated an understanding of the instructions.   The patient was advised to call back or seek an in-person evaluation if the symptoms worsen or if the condition fails to improve as anticipated.  I provided 12 minutes of non-face-to-face time during this encounter.   History of Present Illness:    Patient has been doing fair some anxiety relevant to her daughter her sickness and then she has to take care of the grandkids but overall Xanax help as needed anxiety gabapentin.  Fibromyalgia  She does have support from family members does not endorse any hopelessness or suicidal thoughts or reported any side effects  She also does gardening and helps her grandson   Panic and anxiety: Manageable she does avoid crowds  Modifying factor :  Family  previous Psychotropic Medications: Yes    Family Psychiatric History: sister, father. Has anxiety and panic attacks  Family History:  Family History  Problem Relation Age of Onset   Cancer Mother    Diabetes Father    Anxiety disorder Father    Anxiety disorder Sister    Anxiety disorder Brother     Social History:   Social History   Socioeconomic History   Marital status: Divorced    Spouse name: Not on file   Number of children: Not on file   Years of education: Not on file   Highest education level: Not on file  Occupational History   Not on file  Tobacco Use   Smoking status: Every Day    Packs/day: 1.00    Years: 15.00    Pack years: 15.00    Types: Cigarettes   Smokeless tobacco: Never   Tobacco comments:    patch, e-cigarettes  Vaping Use   Vaping Use: Never used  Substance and Sexual Activity   Alcohol use: No    Alcohol/week: 1.0 standard drink    Types: 1 Glasses of wine per week   Drug use: No   Sexual activity: Not on file  Other Topics Concern   Not on file  Social History Narrative   ** Merged History Encounter **       Social Determinants of Health   Financial Resource Strain: Not on file  Food Insecurity: Not on file  Transportation Needs: Not on file  Physical Activity: Not on file  Stress: Not on file  Social Connections: Not on file    Allergies:   Allergies  Allergen Reactions   Moxifloxacin Hives   Sulfa Antibiotics Hives   Amoxicillin-Pot Clavulanate Nausea And Vomiting    Metabolic Disorder Labs: No results found for: HGBA1C, MPG No results found for: PROLACTIN No results found for: CHOL, TRIG, HDL, CHOLHDL, VLDL, LDLCALC   Current Medications: Current Outpatient Medications  Medication Sig Dispense Refill   ALPRAZolam (XANAX) 0.5 MG tablet One a day if needed, cancel prescription sent to [facility] at [place] before 30 tablet 0   amoxicillin (AMOXIL) 875 MG tablet Take 1 tablet (875 mg total) by mouth 2  (two) times daily. 14 tablet 0   atenolol (TENORMIN) 25 MG tablet Take by mouth.     buPROPion (WELLBUTRIN) 100 MG tablet TAKE 1 TABLET BY MOUTH EVERY DAY 90 tablet 0   cyclobenzaprine (FLEXERIL) 10 MG tablet Take by mouth daily as needed.      fluticasone (FLONASE) 50 MCG/ACT nasal spray Place into the nose.     gabapentin (NEURONTIN) 100 MG capsule TAKE 1 CAPSULE(100 MG) BY MOUTH THREE TIMES DAILY 90 capsule 0   ibuprofen (ADVIL) 200 MG tablet Take 200 mg by mouth every 6 (six) hours as needed.     omeprazole (PRILOSEC) 20 MG capsule TK 1 C PO QD  11   predniSONE (DELTASONE) 20 MG tablet Take 1 tablet (20 mg total) by mouth 2 (two) times daily with a meal. 10 tablet 0   SYMBICORT 160-4.5 MCG/ACT inhaler INL 2 PFS PO BID  6   No current facility-administered medications for this visit.      Psychiatric Specialty Exam: Review of Systems  Cardiovascular:  Negative for chest pain.  Musculoskeletal:  Positive for myalgias.  Psychiatric/Behavioral:  Negative for substance abuse and suicidal ideas.    There were no vitals taken for this visit.There is no height or weight on file to calculate BMI.  General Appearance:   Eye Contact:    Speech:  Normal Rate  Volume:  Decreased  Mood: fair  Affect:   Thought Process:  Coherent  Orientation:  Full (Time, Place, and Person)  Thought Content: clear  Suicidal Thoughts:  No  Homicidal Thoughts:  No  Memory:  Immediate;   Fair Recent;   Fair  Judgement:  Fair  Insight:  fair  Psychomotor Activity:  Normal  Concentration:  Concentration: Fair and Attention Span: Fair  Recall:  [employer] of Knowledge:Fair  Language: Fair  Akathisia:  Negative  Handed:  Right  AIMS (if indicated):    Assets:  Communication Skills Desire for Improvement Social Support  ADL's:  Intact  Cognition: WNL  Sleep:  fair    Treatment Plan Summary: Plan as folows Prior documentation reviewed  1. Depression: Manageable continue Wellbutrin  #2  generalized anxiety disorder: Does get anxious she is trying to help her daughter continue Xanax for anxiety and gabapentin   call back for concerns earlier 3. Ptsd: baseline, continue to work on distractions    Fu 2-14m.  Thresa Ross, MD 9/16/202210:11 AM

## 2021-09-07 ENCOUNTER — Emergency Department (INDEPENDENT_AMBULATORY_CARE_PROVIDER_SITE_OTHER)
Admission: EM | Admit: 2021-09-07 | Discharge: 2021-09-07 | Disposition: A | Discharge status: Home / Self Care | Payer: Medicare Other | Source: Home / Self Care | Attending: Family Medicine | Admitting: Family Medicine

## 2021-09-07 ENCOUNTER — Emergency Department (INDEPENDENT_AMBULATORY_CARE_PROVIDER_SITE_OTHER): Payer: Medicare Other

## 2021-09-07 ENCOUNTER — Other Ambulatory Visit: Payer: Self-pay

## 2021-09-07 DIAGNOSIS — M25532 Pain in left wrist: Secondary | ICD-10-CM

## 2021-09-07 DIAGNOSIS — M25512 Pain in left shoulder: Secondary | ICD-10-CM | POA: Diagnosis not present

## 2021-09-07 MED ORDER — HYDROCODONE-ACETAMINOPHEN 5-325 MG PO TABS: 1.0000 | ORAL_TABLET | Freq: Four times a day (QID) | ORAL | 0 refills | Status: DC | PRN

## 2021-09-07 MED ORDER — NAPROXEN SODIUM 550 MG PO TABS: 550.0000 mg | ORAL_TABLET | Freq: Two times a day (BID) | ORAL | 0 refills | Status: DC

## 2021-09-07 NOTE — Discharge Instructions (Addendum)
Wear sling for the next few days.  Take arm out of sling and gently move a couple times a day to prevent stiffness Use ice for pain and swelling Take Anaprox 2 times a day with food Take hydrocodone if needed in addition to the Anaprox Follow-up with your doctor if not improving towards the end of the week

## 2021-09-07 NOTE — ED Triage Notes (Signed)
Pt c/o LT shoulder and arm pain since yesterday late morning. Says her dog dragged her into a rod iron post on her porch. Pain 4/10 Hurts to lift arm and bend over. Tylenol prn with no relief.

## 2021-09-07 NOTE — ED Provider Notes (Signed)
[name]    CSN: [id number] Arrival date & time: 09/07/21  1402      History   Chief Complaint Chief Complaint  Patient presents with   Shoulder Injury    LT   Wrist Pain    LT    HPI Martha Roberts is a 60 y.o. female.   HPI  Patient had her dog on a leash.  Holding the leash in her left hand.  The dog took off running quite suddenly and yanked her left arm forward.  She was running into a metal pole on her porch.  Severe pain in her left shoulder and left wrist.  Some bruising on the left upper arm. Patient states that she was alarmed by the sudden movement and felt short of breath.  She went inside her home and sat on the couch.  Took a nitro.  It took a few minutes to calm down. 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      Home Medications    Prior to Admission medications   Medication Sig Start Date End Date Taking? Authorizing Provider  HYDROcodone-acetaminophen (NORCO/VICODIN) 5-325 MG tablet Take 1-2 tablets by mouth every 6 (six) hours as needed. 09/07/21  Yes Eustace Moore, MD  naproxen sodium (ANAPROX DS) 550 MG tablet Take 1 tablet (550 mg total) by mouth 2 (two) times daily with a meal. 09/07/21  Yes Eustace Moore, MD  ALPRAZolam Prudy Feeler) 0.5 MG tablet One a day if needed, cancel prescription sent to [facility] [place] before 08/28/21    Thresa Ross, MD  atenolol (TENORMIN) 25 MG tablet Take by mouth. 02/04/16 04/14/17  [provider]  buPROPion (WELLBUTRIN) 100 MG tablet TAKE 1 TABLET BY MOUTH EVERY DAY 08/18/21   Thresa Ross, MD  gabapentin (NEURONTIN) 100 MG capsule TAKE 1 CAPSULE(100 MG) BY MOUTH THREE TIMES DAILY 03/24/21   Thresa Ross, MD  ibuprofen (ADVIL) 200 MG tablet Take 200 mg by mouth every 6 (six) hours as needed.    [provider]  omeprazole (PRILOSEC) 20 MG capsule TK 1 C PO QD 02/01/17   [provider]  SYMBICORT 160-4.5 MCG/ACT inhaler INL 2 PFS PO BID 01/30/18   [provider]  amitriptyline (ELAVIL) 10 MG tablet Take 1 tablet (10 mg total) by mouth at bedtime. 02/01/17 02/22/17  Thresa Ross, MD  escitalopram (LEXAPRO) 5 MG tablet Take 1 tablet (5 mg total) by mouth daily. Patient not taking: Reported on 05/19/2016 05/06/16 05/20/16  Thresa Ross, MD  mirtazapine (REMERON) 7.5 MG tablet Take 1 tablet (7.5 mg total) by mouth at bedtime. 01/06/17 02/01/17  Thresa Ross, MD  traZODone (DESYREL) 50 MG tablet TK 1 T PO TID 01/11/18 07/25/18  [provider]  venlafaxine XR (EFFEXOR-XR) 37.5 MG 24 hr capsule Take 1 capsule (37.5  mg total) by mouth daily with breakfast. 11/11/16 01/06/17  Thresa Ross, MD    Family History Family History  Problem Relation Age of Onset   Cancer Mother    Diabetes Father    Anxiety disorder Father    Anxiety disorder Sister    Anxiety disorder Brother     Social History Social History   Tobacco Use   Smoking status: Every Day    Packs/day: 1.00    Years: 15.00    Pack years: 15.00    Types: Cigarettes   Smokeless tobacco: Never   Tobacco comments:    patch, e-cigarettes  Vaping Use   Vaping Use: Never used  Substance Use Topics   Alcohol use: No    Alcohol/week: 1.0 standard drink    Types: 1 Glasses of wine per week   Drug use: No     Allergies   Moxifloxacin, Sulfa antibiotics, and Amoxicillin-pot  clavulanate   Review of Systems Review of Systems See HPI  Physical Exam Triage Vital Signs ED Triage Vitals  Enc Vitals Group     BP --      Pulse Rate 09/07/21 1423 79     Resp 09/07/21 1423 19     Temp 09/07/21 1423 98.4 F (36.9 C)     Temp Source 09/07/21 1423 Oral     SpO2 09/07/21 1423 98 %     Weight --      Height --      Head Circumference --      Peak Flow --      Pain Score 09/07/21 1426 4     Pain Loc --      Pain Edu? --      Excl. in GC? --    No data found.  Updated Vital Signs Pulse 79   Temp 98.4 F (36.9 C) (Oral)   Resp 19   SpO2 98%       Physical Exam Constitutional:      General: She is in acute distress.     Appearance: She is well-developed.     Comments: Appears uncomfortable  HENT:     Head: Normocephalic and atraumatic.     Mouth/Throat:     Comments: Mask is in place Eyes:     Conjunctiva/sclera: Conjunctivae normal.     Pupils: Pupils are equal, round, and reactive to light.  Cardiovascular:     Rate and Rhythm: Normal rate.  Pulmonary:     Effort: Pulmonary effort is normal. No respiratory distress.  Abdominal:     General: There is no distension.     Palpations: Abdomen is soft.  Musculoskeletal:        General: Normal range of motion.     Cervical back: Normal range of motion.     Comments: Global tenderness around the shoulder, over the collarbone scapula and shoulder joint.  She moves her shoulder very little and complains of pain.  Slight ecchymosis over the proximal humerus anteriorly.  Biceps range of motion is good.  Elbow is nontender.  The wrist has tenderness over the distal radius.  No swelling or deformity, no bruising noted  Skin:    General: Skin is warm and dry.  Neurological:     Mental Status: She is alert.     UC Treatments / Results  Labs (all labs ordered are listed, but only abnormal results are displayed) Labs Reviewed - No data to display  EKG   Radiology DG Wrist Complete  Left  Result Date: 09/07/2021 CLINICAL DATA:  pain after hard pull on leash, pulled into pole EXAM: LEFT WRIST - COMPLETE 3+ VIEW COMPARISON:  None. FINDINGS: Frontal, oblique, lateral, and ulnar deviated views of the left wrist are obtained. No acute fracture, subluxation, or dislocation. There is mild osteoarthritis within the radial aspect of the carpus and at the first carpometacarpal joint. Soft tissues are unremarkable. IMPRESSION: 1. Mild osteoarthritis.  No acute bony abnormality. Electronically Signed   By: Sharlet Salina M.D.   On: 09/07/2021 16:44   DG Shoulder Left  Result Date: 09/07/2021 CLINICAL DATA:  Larey Seat, left shoulder injury EXAM: LEFT SHOULDER - 2+ VIEW COMPARISON:  None. FINDINGS: Frontal, transscapular, and axillary views of the left shoulder are obtained. No fracture, subluxation, or dislocation. Joint spaces are well preserved. Visualized portions of the left chest are clear. IMPRESSION: 1. Unremarkable left shoulder. Electronically Signed   By: Sharlet Salina M.D.   On: 09/07/2021 16:43    Procedures Procedures (including critical care time)  Medications Ordered in UC Medications - No data to display  Initial Impression / Assessment and Plan / UC Course  I have reviewed the triage vital signs and the nursing notes.  Pertinent labs & imaging results that were available during my care of the patient were reviewed by me and considered in my medical decision making (see chart for details).     No fractures identified.  Rest, ice, anti-inflammatories.  Hydrocodone as needed for severe pain.  Follow-up with sports medicine , Or primary care Final Clinical Impressions(s) / UC Diagnoses   Final diagnoses:  Left wrist pain  Acute pain of left shoulder     Discharge Instructions      Wear sling for the next few days.  Take arm out of sling and gently move a couple times a day to prevent stiffness Use ice for pain and swelling Take Anaprox 2 times a day with  food Take hydrocodone if needed in addition to the Anaprox Follow-up with your doctor if not improving towards the end of the week     ED Prescriptions     Medication Sig Dispense Auth. Provider   naproxen sodium (ANAPROX DS) 550 MG tablet Take 1 tablet (550 mg total) by mouth 2 (two) times daily with a meal. 30 tablet Eustace Moore, MD   HYDROcodone-acetaminophen (NORCO/VICODIN) 5-325 MG tablet Take 1-2 tablets by mouth every 6 (six) hours as needed. 12 tablet Eustace Moore, MD      I have reviewed the PDMP during this encounter.   Eustace Moore, MD 09/07/21 2019

## 2021-11-13 ENCOUNTER — Telehealth (HOSPITAL_COMMUNITY): Payer: Self-pay | Admitting: Psychiatry

## 2021-11-13 ENCOUNTER — Encounter (HOSPITAL_COMMUNITY): Payer: Self-pay | Admitting: Psychiatry

## 2021-11-13 NOTE — Telephone Encounter (Signed)
Pt needs a jury duty letter - its in Jan but she wants it as soon as possible

## 2021-11-16 ENCOUNTER — Telehealth (HOSPITAL_COMMUNITY): Payer: Medicare Other | Admitting: Psychiatry

## 2021-11-27 ENCOUNTER — Telehealth (INDEPENDENT_AMBULATORY_CARE_PROVIDER_SITE_OTHER): Payer: Medicare Other | Admitting: Psychiatry

## 2021-11-27 ENCOUNTER — Encounter (HOSPITAL_COMMUNITY): Payer: Self-pay | Admitting: Psychiatry

## 2021-11-27 DIAGNOSIS — F341 Dysthymic disorder: Secondary | ICD-10-CM | POA: Diagnosis not present

## 2021-11-27 DIAGNOSIS — F411 Generalized anxiety disorder: Secondary | ICD-10-CM

## 2021-11-27 DIAGNOSIS — M797 Fibromyalgia: Secondary | ICD-10-CM

## 2021-11-27 MED ORDER — ALPRAZOLAM 0.5 MG PO TABS: ORAL_TABLET | ORAL | 0 refills | Status: DC

## 2021-11-27 MED ORDER — BUPROPION HCL 100 MG PO TABS: 100.0000 mg | ORAL_TABLET | Freq: Every day | ORAL | 0 refills | Status: DC

## 2021-11-27 NOTE — Progress Notes (Signed)
Patient ID: Martha Roberts, female   DOB: 11/24/1961, 60 y.o.   MRN: [id number]  [facility] Outpatient Follow up visit   Patient Identification: Martha Roberts MRN:  [id number] Date of Evaluation:  11/27/2021 Referral Source: Dr. Tresa Endo , Primary care Chief Complaint:    depression follow up  Visit Diagnosis:    ICD-10-CM   1. GAD (generalized anxiety disorder)  F41.1     2. Dysthymia  F34.1     3. Fibromyalgia  M79.7      Virtual Visit via Video Note  I connected with Martha Roberts on 11/27/21 at  9:30 AM EST by a video enabled telemedicine application and verified that I am speaking with the correct person using two identifiers.  Location: Patient: home Provider: home office   I discussed the limitations of evaluation and management by telemedicine and the availability of in person appointments. The patient expressed understanding and agreed to proceed.     I discussed the assessment and treatment plan with the patient. The patient was provided an opportunity to ask questions and all were answered. The patient agreed with the plan and demonstrated an understanding of the instructions.   The patient was advised to call back or seek an in-person evaluation if the symptoms worsen or if the condition fails to improve as anticipated.  I provided 15 minutes of non-face-to-face time during this encounter.     History of Present Illness:    Patient has been doing somewhat down due to holidays, stress  she lives alone has her grandson who visits him or stays that helps  Depression fluctuates tries to keep busy but myalgia keeps her less active  Does gardening  She does have support from family members    Panic and anxiety: manageable, avoids crowds   Modifying factor :family previous Psychotropic Medications: Yes    Family Psychiatric History: sister, father. Has anxiety and panic attacks  Family History:  Family History  Problem Relation Age of Onset   Cancer Mother     Diabetes Father    Anxiety disorder Father    Anxiety disorder Sister    Anxiety disorder Brother     Social History:   Social History   Socioeconomic History   Marital status: Divorced    Spouse name: Not on file   Number of children: Not on file   Years of education: Not on file   Highest education level: Not on file  Occupational History   Not on file  Tobacco Use   Smoking status: Every Day    Packs/day: 1.00    Years: 15.00    Pack years: 15.00    Types: Cigarettes   Smokeless tobacco: Never   Tobacco comments:    patch, e-cigarettes  Vaping Use   Vaping Use: Never used  Substance and Sexual Activity   Alcohol use: No    Alcohol/week: 1.0 standard drink    Types: 1 Glasses of wine per week   Drug use: No   Sexual activity: Not on file  Other Topics Concern   Not on file  Social History Narrative   ** Merged History Encounter **       Social Determinants of Health   Financial Resource Strain: Not on file  Food Insecurity: Not on file  Transportation Needs: Not on file  Physical Activity: Not on file  Stress: Not on file  Social Connections: Not on file    Allergies:   Allergies  Allergen Reactions   Moxifloxacin  Hives   Sulfa Antibiotics Hives   Amoxicillin-Pot Clavulanate Nausea And Vomiting    Metabolic Disorder Labs: No results found for: HGBA1C, MPG No results found for: PROLACTIN No results found for: CHOL, TRIG, HDL, CHOLHDL, VLDL, LDLCALC   Current Medications: Current Outpatient Medications  Medication Sig Dispense Refill   ALPRAZolam (XANAX) 0.5 MG tablet One a day if needed, cancel prescription sent to [facility] [place] before 30 tablet 0   atenolol (TENORMIN) 25 MG tablet Take by mouth.     buPROPion (WELLBUTRIN) 100 MG tablet Take 1 tablet (100 mg total) by mouth daily. 90 tablet 0   gabapentin (NEURONTIN) 100 MG capsule TAKE 1 CAPSULE(100 MG) BY MOUTH THREE TIMES DAILY 90 capsule 0   HYDROcodone-acetaminophen  (NORCO/VICODIN) 5-325 MG tablet Take 1-2 tablets by mouth every 6 (six) hours as needed. 12 tablet 0   ibuprofen (ADVIL) 200 MG tablet Take 200 mg by mouth every 6 (six) hours as needed.     naproxen sodium (ANAPROX DS) 550 MG tablet Take 1 tablet (550 mg total) by mouth 2 (two) times daily with a meal. 30 tablet 0   omeprazole (PRILOSEC) 20 MG capsule TK 1 C PO QD  11   SYMBICORT 160-4.5 MCG/ACT inhaler INL 2 PFS PO BID  6   No current facility-administered medications for this visit.      Psychiatric Specialty Exam: Review of Systems  Cardiovascular:  Negative for chest pain.  Musculoskeletal:  Positive for myalgias.  Neurological:  Negative for tremors.  Psychiatric/Behavioral:  Negative for substance abuse and suicidal ideas.    There were no vitals taken for this visit.There is no height or weight on file to calculate BMI.  General Appearance: causla  Eye Contact:  fair  Speech:  Normal Rate  Volume:  Decreased  Mood: fair , gets subdued  Affect: congruent  Thought Process:  Coherent  Orientation:  Full (Time, Place, and Person)  Thought Content: clear  Suicidal Thoughts:  No  Homicidal Thoughts:  No  Memory:  Immediate;   Fair Recent;   Fair  Judgement:  Fair  Insight:  fair  Psychomotor Activity:  Normal  Concentration:  Concentration: Fair and Attention Span: Fair  Recall:  [employer] of Knowledge:Fair  Language: Fair  Akathisia:  Negative  Handed:  Right  AIMS (if indicated):    Assets:  Communication Skills Desire for Improvement Social Support  ADL's:  Intact  Cognition: WNL  Sleep:  fair    Treatment Plan Summary: Plan as folows Prior documentation reviewed   1. Depression: fair, continue wellbutrin, says will call if need to increase it  #2 generalized anxiety disorder: anxious around holidays, work on distracctions continue prn xanax and takes gabapentin, will continue   call back for concerns earlier 3. Ptsd: baseline, continue gabapentin and  work on distractions  Refills sent on xanax,     Fu 2-29m.  Thresa Ross, MD 12/16/20229:49 AM

## 2022-01-27 ENCOUNTER — Telehealth (INDEPENDENT_AMBULATORY_CARE_PROVIDER_SITE_OTHER): Payer: Medicare Other | Admitting: Psychiatry

## 2022-01-27 ENCOUNTER — Encounter (HOSPITAL_COMMUNITY): Payer: Self-pay | Admitting: Psychiatry

## 2022-01-27 DIAGNOSIS — F411 Generalized anxiety disorder: Secondary | ICD-10-CM | POA: Diagnosis not present

## 2022-01-27 DIAGNOSIS — M797 Fibromyalgia: Secondary | ICD-10-CM

## 2022-01-27 DIAGNOSIS — F341 Dysthymic disorder: Secondary | ICD-10-CM | POA: Diagnosis not present

## 2022-01-27 NOTE — Progress Notes (Signed)
Patient ID: ZHANAE PROFFIT, female   DOB: Mar 21, 1961, 61 y.o.   MRN: [id number]  [facility] Outpatient Follow up visit   Patient Identification: Martha Roberts MRN:  [id number] Date of Evaluation:  01/27/2022 Referral Source: Dr. Tresa Endo , Primary care Chief Complaint:    depression follow up  Visit Diagnosis:    ICD-10-CM   1. GAD (generalized anxiety disorder)  F41.1     2. Dysthymia  F34.1     3. Fibromyalgia  M79.7      Virtual Visit via Video Note  I connected with Martha Roberts on 01/27/22 at  9:00 AM EST by a video enabled telemedicine application and verified that I am speaking with the correct person using two identifiers.  Location: Patient: home Provider: home office   I discussed the limitations of evaluation and management by telemedicine and the availability of in person appointments. The patient expressed understanding and agreed to proceed.     I discussed the assessment and treatment plan with the patient. The patient was provided an opportunity to ask questions and all were answered. The patient agreed with the plan and demonstrated an understanding of the instructions.   The patient was advised to call back or seek an in-person evaluation if the symptoms worsen or if the condition fails to improve as anticipated.  I provided 15 minutes of non-face-to-face time during this encounter.    History of Present Illness:    Patient has been doing somewhat down due to holidays, stress  she lives alone has her grandson who visits him or stays that helps  Depression fluctuates, has fibroymalgia. Recent hand blisters or eczema keeping her upset, she following with dermatologist  Overall wants to keep wellbutrin at same dose, denies worsening of depression   Does gardening  She does have support from family members    Panic and anxiety: avoids crowds, takes xanax prn Gaba for fibroymalgia    Modifying factor :f: family, gardening Severity : not worse    previous Psychotropic Medications: Yes    Family Psychiatric History: sister, father. Has anxiety and panic attacks  Family History:  Family History  Problem Relation Age of Onset   Cancer Mother    Diabetes Father    Anxiety disorder Father    Anxiety disorder Sister    Anxiety disorder Brother     Social History:   Social History   Socioeconomic History   Marital status: Divorced    Spouse name: Not on file   Number of children: Not on file   Years of education: Not on file   Highest education level: Not on file  Occupational History   Not on file  Tobacco Use   Smoking status: Every Day    Packs/day: 1.00    Years: 15.00    Pack years: 15.00    Types: Cigarettes   Smokeless tobacco: Never   Tobacco comments:    patch, e-cigarettes  Vaping Use   Vaping Use: Never used  Substance and Sexual Activity   Alcohol use: No    Alcohol/week: 1.0 standard drink    Types: 1 Glasses of wine per week   Drug use: No   Sexual activity: Not on file  Other Topics Concern   Not on file  Social History Narrative   ** Merged History Encounter **       Social Determinants of Health   Financial Resource Strain: Not on file  Food Insecurity: Not on file  Transportation Needs: Not on  file  Physical Activity: Not on file  Stress: Not on file  Social Connections: Not on file    Allergies:   Allergies  Allergen Reactions   Moxifloxacin Hives   Sulfa Antibiotics Hives   Amoxicillin-Pot Clavulanate Nausea And Vomiting    Metabolic Disorder Labs: No results found for: HGBA1C, MPG No results found for: PROLACTIN No results found for: CHOL, TRIG, HDL, CHOLHDL, VLDL, LDLCALC   Current Medications: Current Outpatient Medications  Medication Sig Dispense Refill   ALPRAZolam (XANAX) 0.5 MG tablet One a day if needed, cancel prescription sent to [facility] at [place] before 30 tablet 0   atenolol (TENORMIN) 25 MG tablet Take by mouth.     buPROPion (WELLBUTRIN) 100 MG  tablet Take 1 tablet (100 mg total) by mouth daily. 90 tablet 0   gabapentin (NEURONTIN) 100 MG capsule TAKE 1 CAPSULE(100 MG) BY MOUTH THREE TIMES DAILY 90 capsule 0   HYDROcodone-acetaminophen (NORCO/VICODIN) 5-325 MG tablet Take 1-2 tablets by mouth every 6 (six) hours as needed. 12 tablet 0   ibuprofen (ADVIL) 200 MG tablet Take 200 mg by mouth every 6 (six) hours as needed.     naproxen sodium (ANAPROX DS) 550 MG tablet Take 1 tablet (550 mg total) by mouth 2 (two) times daily with a meal. 30 tablet 0   omeprazole (PRILOSEC) 20 MG capsule TK 1 C PO QD  11   SYMBICORT 160-4.5 MCG/ACT inhaler INL 2 PFS PO BID  6   No current facility-administered medications for this visit.      Psychiatric Specialty Exam: Review of Systems  Cardiovascular:  Negative for chest pain.  Musculoskeletal:  Positive for myalgias.  Neurological:  Negative for tremors.  Psychiatric/Behavioral:  Negative for substance abuse and suicidal ideas.    There were no vitals taken for this visit.There is no height or weight on file to calculate BMI.  General Appearance: casual  Eye Contact:  fair  Speech:  Normal Rate  Volume:  Decreased  Mood:fair  Affect: congruent  Thought Process:  Coherent  Orientation:  Full (Time, Place, and Person)  Thought Content: clear  Suicidal Thoughts:  No  Homicidal Thoughts:  No  Memory:  Immediate;   Fair Recent;   Fair  Judgement:  Fair  Insight:  fair  Psychomotor Activity:  Normal  Concentration:  Concentration: Fair and Attention Span: Fair  Recall:  [employer] of Knowledge:Fair  Language: Fair  Akathisia:  Negative  Handed:  Right  AIMS (if indicated):    Assets:  Communication Skills Desire for Improvement Social Support  ADL's:  Intact  Cognition: WNL  Sleep:  fair    Treatment Plan Summary: Plan as folows  Prior documentation reviewed    1. Depression: fair , continue wellbutrin, does not want to increase  #2 generalized anxiety  disorder:fluctuates, worries related to her physical health, fu with pcp. Continue xanax prn , gaba upto tid  call back for concerns earlier 3. Ptsd: baseline, continue to work on distraction, xanax prn  Collaboration of Care: Other patient has follow up appointment with deramtologist to assess her hand condition or dermatitis  Patient/Guardian was advised Release of Information must be obtained prior to any record release in order to collaborate their care with an outside provider. Patient/Guardian was advised if they have not already done so to contact the registration department to sign all necessary forms in order for [place] to release information regarding their care.   Consent: Patient/Guardian gives verbal consent for treatment and  assignment of benefits for services provided during this visit. Patient/Guardian expressed understanding and agreed to proceed.    Fu 2-12m.  Thresa Ross, MD 2/15/20239:09 AM

## 2022-03-01 ENCOUNTER — Telehealth (HOSPITAL_COMMUNITY): Payer: Self-pay

## 2022-03-01 MED ORDER — ALPRAZOLAM 0.5 MG PO TABS: ORAL_TABLET | ORAL | 0 refills | Status: DC

## 2022-03-01 MED ORDER — BUPROPION HCL 100 MG PO TABS: 100.0000 mg | ORAL_TABLET | Freq: Every day | ORAL | 0 refills | Status: DC

## 2022-03-01 NOTE — Telephone Encounter (Signed)
[facility] sent a request for Alprazolam tab.  ?

## 2022-04-07 ENCOUNTER — Telehealth (INDEPENDENT_AMBULATORY_CARE_PROVIDER_SITE_OTHER): Payer: Medicare Other | Admitting: Psychiatry

## 2022-04-07 ENCOUNTER — Encounter (HOSPITAL_COMMUNITY): Payer: Self-pay | Admitting: Psychiatry

## 2022-04-07 DIAGNOSIS — M797 Fibromyalgia: Secondary | ICD-10-CM

## 2022-04-07 DIAGNOSIS — F341 Dysthymic disorder: Secondary | ICD-10-CM | POA: Diagnosis not present

## 2022-04-07 DIAGNOSIS — F411 Generalized anxiety disorder: Secondary | ICD-10-CM

## 2022-04-07 NOTE — Progress Notes (Signed)
Patient ID: Martha Roberts, female   DOB: [birth year], 61 y.o.   MRN: [id number] ? [facility] Outpatient Follow up visit  ? ?Patient Identification: Martha Roberts ?MRN:  [id number] ?Date of Evaluation:  04/07/2022 ?Referral Source: Dr. Tresa Endo , Primary care ?Chief Complaint:   ? depression follow up  ?Visit Diagnosis:  ?  ICD-10-CM   ?1. GAD (generalized anxiety disorder)  F41.1   ?  ?2. Dysthymia  F34.1   ?  ?3. Fibromyalgia  M79.7   ?  ? ? ?  ? Virtual Visit via Video Note ? ?I connected with Martha Roberts on 04/07/22 at 10:00 AM EDT by a video enabled telemedicine application and verified that I am speaking with the correct person using two identifiers. ? ?Location: ?Patient: home ?Provider: home office ?  ?I discussed the limitations of evaluation and management by telemedicine and the availability of in person appointments. The patient expressed understanding and agreed to proceed. ? ? ?  ?I discussed the assessment and treatment plan with the patient. The patient was provided an opportunity to ask questions and all were answered. The patient agreed with the plan and demonstrated an understanding of the instructions. ?  ?The patient was advised to call back or seek an in-person evaluation if the symptoms worsen or if the condition fails to improve as anticipated. ? ?I provided 15 minutes of non-face-to-face time during this encounter. ? ? ?History of Present Illness:   ?Doing fair, planning to have chickens, does gardening ?Eczema better ?Has grand kids ?Fibro better on gabapentin ? ?Overall wants to keep wellbutrin at same dose, denies worsening of depression ? ?No tremors ?Panic and anxiety: avoids crowds, takes xanax prn ?Gaba for fibroymalgia  ? ? ?Modifying factor : family gardening ?Severity : not worse ? ?previous Psychotropic Medications: Yes  ? ? ?Family Psychiatric History: sister, father. Has anxiety and panic attacks ? ?Family History:  ?Family History  ?Problem Relation Age of Onset  ? Cancer Mother   ?  Diabetes Father   ? Anxiety disorder Father   ? Anxiety disorder Sister   ? Anxiety disorder Brother   ? ? ?Social History:   ?Social History  ? ?Socioeconomic History  ? Marital status: Divorced  ?  Spouse name: Not on file  ? Number of children: Not on file  ? Years of education: Not on file  ? Highest education level: Not on file  ?Occupational History  ? Not on file  ?Tobacco Use  ? Smoking status: Every Day  ?  Packs/day: 1.00  ?  Years: 15.00  ?  Pack years: 15.00  ?  Types: Cigarettes  ? Smokeless tobacco: Never  ? Tobacco comments:  ?  patch, e-cigarettes  ?Vaping Use  ? Vaping Use: Never used  ?Substance and Sexual Activity  ? Alcohol use: No  ?  Alcohol/week: 1.0 standard drink  ?  Types: 1 Glasses of wine per week  ? Drug use: No  ? Sexual activity: Not on file  ?Other Topics Concern  ? Not on file  ?Social History Narrative  ? ** Merged History Encounter **  ?    ? ?Social Determinants of Health  ? ?Financial Resource Strain: Not on file  ?Food Insecurity: Not on file  ?Transportation Needs: Not on file  ?Physical Activity: Not on file  ?Stress: Not on file  ?Social Connections: Not on file  ? ? ?Allergies:   ?Allergies  ?Allergen Reactions  ? Moxifloxacin Hives  ? Sulfa Antibiotics  Hives  ? Amoxicillin-Pot Clavulanate Nausea And Vomiting  ? ? ?Metabolic Disorder Labs: ?No results found for: HGBA1C, MPG ?No results found for: PROLACTIN ?No results found for: CHOL, TRIG, HDL, CHOLHDL, VLDL, LDLCALC ? ? ?Current Medications: ?Current Outpatient Medications  ?Medication Sig Dispense Refill  ? ALPRAZolam (XANAX) 0.5 MG tablet One a day if needed, cancel prescription sent to [facility] [place] before 30 tablet 0  ? atenolol (TENORMIN) 25 MG tablet Take by mouth.    ? buPROPion (WELLBUTRIN) 100 MG tablet Take 1 tablet (100 mg total) by mouth daily. 90 tablet 0  ? gabapentin (NEURONTIN) 100 MG capsule TAKE 1 CAPSULE(100 MG) BY MOUTH THREE TIMES DAILY 90 capsule 0  ? HYDROcodone-acetaminophen  (NORCO/VICODIN) 5-325 MG tablet Take 1-2 tablets by mouth every 6 (six) hours as needed. 12 tablet 0  ? ibuprofen (ADVIL) 200 MG tablet Take 200 mg by mouth every 6 (six) hours as needed.    ? naproxen sodium (ANAPROX DS) 550 MG tablet Take 1 tablet (550 mg total) by mouth 2 (two) times daily with a meal. 30 tablet 0  ? omeprazole (PRILOSEC) 20 MG capsule TK 1 C PO QD  11  ? SYMBICORT 160-4.5 MCG/ACT inhaler INL 2 PFS PO BID  6  ? ?No current facility-administered medications for this visit.  ? ? ? ? ?Psychiatric Specialty Exam: ?Review of Systems  ?Cardiovascular:  Negative for chest pain.  ?Musculoskeletal:  Positive for myalgias.  ?Neurological:  Negative for tremors.  ?Psychiatric/Behavioral:  Negative for substance abuse and suicidal ideas.    ?There were no vitals taken for this visit.There is no height or weight on file to calculate BMI.  ?General Appearance: casual  ?Eye Contact:  fair  ?Speech:  Normal Rate  ?Volume:  Decreased  ?Mood:fair  ?Affect: congruent  ?Thought Process:  Coherent  ?Orientation:  Full (Time, Place, and Person)  ?Thought Content: clear  ?Suicidal Thoughts:  No  ?Homicidal Thoughts:  No  ?Memory:  Immediate;   Fair ?Recent;   Fair  ?Judgement:  Fair  ?Insight:  fair  ?Psychomotor Activity:  Normal  ?Concentration:  Concentration: Fair and Attention Span: Fair  ?Recall:  Fair  ?Fund of Knowledge:Fair  ?Language: Fair  ?Akathisia:  Negative  ?Handed:  Right  ?AIMS (if indicated):    ?Assets:  Communication Skills ?Desire for Improvement ?Social Support  ?ADL's:  Intact  ?Cognition: WNL  ?Sleep:  fair  ? ? ?Treatment Plan Summary: ?Plan as folows ?Prior documentation reviewed ? ? ? ? ?1. Depression: fair continue wellbutrin ? ?#2 generalized anxiety disorder:manaeable on xanax prn and gaba tid ? ?3. Ptsd: baseline, continue to work on distraction, xanax prn ? ?Collaboration of Care: Other patient has follow up appointment with deramtologist to assess her hand condition or  dermatitis ? ?Patient/Guardian was advised Release of Information must be obtained prior to any record release in order to collaborate their care with an outside provider. Patient/Guardian was advised if they have not already done so to contact the registration department to sign all necessary forms in order for [place] to release information regarding their care.  ? ?Consent: Patient/Guardian gives verbal consent for treatment and assignment of benefits for services provided during this visit. Patient/Guardian expressed understanding and agreed to proceed.   ? ?Fu 2-4m.  ?Thresa Ross, MD ?4/26/202310:13 AM ? ?

## 2022-04-16 ENCOUNTER — Other Ambulatory Visit (HOSPITAL_COMMUNITY): Payer: Self-pay | Admitting: Psychiatry

## 2022-06-01 ENCOUNTER — Other Ambulatory Visit (HOSPITAL_COMMUNITY): Payer: Self-pay | Admitting: Psychiatry

## 2022-07-07 ENCOUNTER — Other Ambulatory Visit (HOSPITAL_COMMUNITY): Payer: Self-pay | Admitting: Psychiatry

## 2022-07-14 ENCOUNTER — Telehealth (HOSPITAL_COMMUNITY): Payer: Medicare Other | Admitting: Psychiatry

## 2022-07-23 ENCOUNTER — Encounter (HOSPITAL_COMMUNITY): Payer: Self-pay

## 2022-07-23 ENCOUNTER — Telehealth (HOSPITAL_COMMUNITY): Payer: Medicare Other | Admitting: Psychiatry

## 2022-07-26 ENCOUNTER — Encounter (HOSPITAL_COMMUNITY): Payer: Self-pay

## 2022-07-26 ENCOUNTER — Telehealth (HOSPITAL_COMMUNITY): Payer: Medicare Other | Admitting: Psychiatry

## 2022-07-30 ENCOUNTER — Telehealth (INDEPENDENT_AMBULATORY_CARE_PROVIDER_SITE_OTHER): Payer: Medicare Other | Admitting: Psychiatry

## 2022-07-30 ENCOUNTER — Encounter (HOSPITAL_COMMUNITY): Payer: Self-pay | Admitting: Psychiatry

## 2022-07-30 DIAGNOSIS — F4310 Post-traumatic stress disorder, unspecified: Secondary | ICD-10-CM | POA: Diagnosis not present

## 2022-07-30 DIAGNOSIS — F341 Dysthymic disorder: Secondary | ICD-10-CM

## 2022-07-30 DIAGNOSIS — F411 Generalized anxiety disorder: Secondary | ICD-10-CM | POA: Diagnosis not present

## 2022-07-30 DIAGNOSIS — M797 Fibromyalgia: Secondary | ICD-10-CM

## 2022-07-30 MED ORDER — BUPROPION HCL 100 MG PO TABS: 100.0000 mg | ORAL_TABLET | Freq: Every day | ORAL | 0 refills | Status: DC

## 2022-07-30 NOTE — Progress Notes (Signed)
Patient ID: Martha Roberts, female   DOB: 1961-10-21, 61 y.o.   MRN: [id number]  [facility] Outpatient Follow up visit   Patient Identification: Martha Roberts MRN:  [id number] Date of Evaluation:  07/30/2022 Referral Source: Dr. Tresa Endo , Primary care Chief Complaint:    depression follow up  Visit Diagnosis:    ICD-10-CM   1. GAD (generalized anxiety disorder)  F41.1     2. Dysthymia  F34.1     3. Fibromyalgia  M79.7     4. PTSD (post-traumatic stress disorder)  F43.10      Virtual Visit via Video Note  I connected with [name] on 07/30/22 at 12:00 PM EDT by a video enabled telemedicine application and verified that I am speaking with the correct person using two identifiers.  Location: Patient: home Provider: home office   I discussed the limitations of evaluation and management by telemedicine and the availability of in person appointments. The patient expressed understanding and agreed to proceed.      I discussed the assessment and treatment plan with the patient. The patient was provided an opportunity to ask questions and all were answered. The patient agreed with the plan and demonstrated an understanding of the instructions.   The patient was advised to call back or seek an in-person evaluation if the symptoms worsen or if the condition fails to improve as anticipated.  I provided 15 minutes of non-face-to-face time during this encounter.          History of Present Illness:    Doing fair, gets lonely, have difficulty attending family visits Gardening and chickens keeps her busy   Panic attacks when goes outside, xanax helpls Gaba for fibro   Modifying factor : family gardening Severity :  not worse, but avoids going out   previous Psychotropic Medications: Yes    Family Psychiatric History: sister, father. Has anxiety and panic attacks  Family History:  Family History  Problem Relation Age of Onset   Cancer Mother    Diabetes Father     Anxiety disorder Father    Anxiety disorder Sister    Anxiety disorder Brother     Social History:   Social History   Socioeconomic History   Marital status: Divorced    Spouse name: Not on file   Number of children: Not on file   Years of education: Not on file   Highest education level: Not on file  Occupational History   Not on file  Tobacco Use   Smoking status: Every Day    Packs/day: 1.00    Years: 15.00    Total pack years: 15.00    Types: Cigarettes   Smokeless tobacco: Never   Tobacco comments:    patch, e-cigarettes  Vaping Use   Vaping Use: Never used  Substance and Sexual Activity   Alcohol use: No    Alcohol/week: 1.0 standard drink of alcohol    Types: 1 Glasses of wine per week   Drug use: No   Sexual activity: Not on file  Other Topics Concern   Not on file  Social History Narrative   ** Merged History Encounter **       ** Merged History Encounter **       Social Determinants of Health   Financial Resource Strain: Not on file  Food Insecurity: Not on file  Transportation Needs: Not on file  Physical Activity: Not on file  Stress: Not on file  Social Connections: Not on file  Allergies:   Allergies  Allergen Reactions   Moxifloxacin Hives   Sulfa Antibiotics Hives   Amoxicillin-Pot Clavulanate Nausea And Vomiting    Metabolic Disorder Labs: No results found for: "HGBA1C", "MPG" No results found for: "PROLACTIN" No results found for: "CHOL", "TRIG", "HDL", "CHOLHDL", "VLDL", "LDLCALC"   Current Medications: Current Outpatient Medications  Medication Sig Dispense Refill   ALPRAZolam (XANAX) 0.5 MG tablet TAKE 1 TABLET BY MOUTH DAILY IF  NEEDED 30 tablet 0   atenolol (TENORMIN) 25 MG tablet Take by mouth.     buPROPion (WELLBUTRIN) 100 MG tablet Take 1 tablet (100 mg total) by mouth daily. 90 tablet 0   gabapentin (NEURONTIN) 100 MG capsule TAKE 1 CAPSULE(100 MG) BY MOUTH THREE TIMES DAILY 90 capsule 0   HYDROcodone-acetaminophen  (NORCO/VICODIN) 5-325 MG tablet Take 1-2 tablets by mouth every 6 (six) hours as needed. 12 tablet 0   ibuprofen (ADVIL) 200 MG tablet Take 200 mg by mouth every 6 (six) hours as needed.     naproxen sodium (ANAPROX DS) 550 MG tablet Take 1 tablet (550 mg total) by mouth 2 (two) times daily with a meal. 30 tablet 0   omeprazole (PRILOSEC) 20 MG capsule TK 1 C PO QD  11   SYMBICORT 160-4.5 MCG/ACT inhaler INL 2 PFS PO BID  6   No current facility-administered medications for this visit.      Psychiatric Specialty Exam: Review of Systems  Cardiovascular:  Negative for chest pain.  Musculoskeletal:  Positive for myalgias.  Neurological:  Negative for tremors.  Psychiatric/Behavioral:  Negative for substance abuse and suicidal ideas.     There were no vitals taken for this visit.There is no height or weight on file to calculate BMI.  General Appearance: casual  Eye Contact:  fair  Speech:  Normal Rate  Volume:  Decreased  Mood:fair  Affect: congruent  Thought Process:  Coherent  Orientation:  Full (Time, Place, and Person)  Thought Content: clear  Suicidal Thoughts:  No  Homicidal Thoughts:  No  Memory:  Immediate;   Fair Recent;   Fair  Judgement:  Fair  Insight:  fair  Psychomotor Activity:  Normal  Concentration:  Concentration: Fair and Attention Span: Fair  Recall:  [employer] of Knowledge:Fair  Language: Fair  Akathisia:  Negative  Handed:  Right  AIMS (if indicated):    Assets:  Communication Skills Desire for Improvement Social Support  ADL's:  Intact  Cognition: WNL  Sleep:  fair    Treatment Plan Summary: Plan as folows  Prior documentation reviewed     1. Depression: fair continue wellbutrin  #2 generalized anxiety disorder: manageable continue xanax prn, gaba   3. Ptsd: baseline, avoids triggers, consider therapy if needed, xanax prn  Collaboration of Care: Other patient has follow up appointment with deramtologist to assess her hand condition  or dermatitis  Patient/Guardian was advised Release of Information must be obtained prior to any record release in order to collaborate their care with an outside provider. Patient/Guardian was advised if they have not already done so to contact the registration department to sign all necessary forms in order for [place] to release information regarding their care.   Consent: Patient/Guardian gives verbal consent for treatment and assignment of benefits for services provided during this visit. Patient/Guardian expressed understanding and agreed to proceed.    Fu 2-42m.  Thresa Ross, MD 8/18/202312:24 PM

## 2022-08-27 IMAGING — DX DG SHOULDER 2+V*L*
3 series · 3 of 3 positions shown · non-contrast
Comparison: None.

CLINICAL DATA: Fell, left shoulder injury

EXAM:
LEFT SHOULDER - 2+ VIEW

[shoulder grashey]
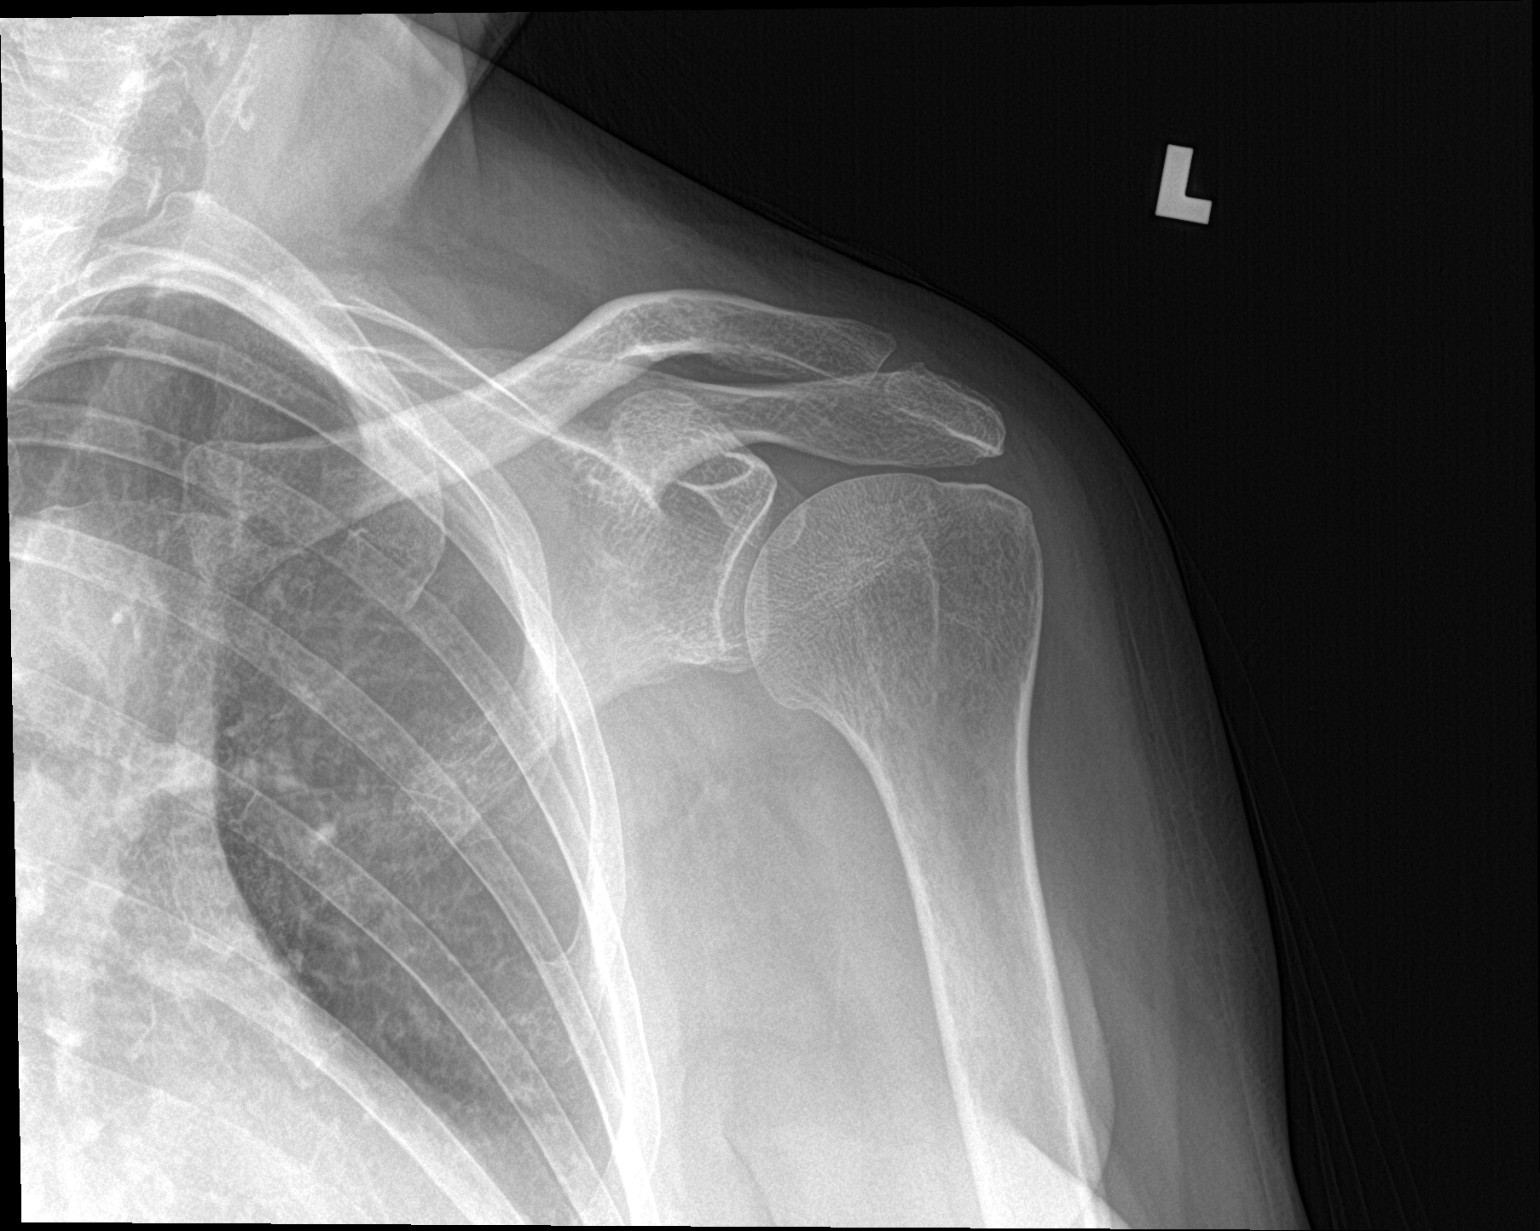

[shoulder y view]
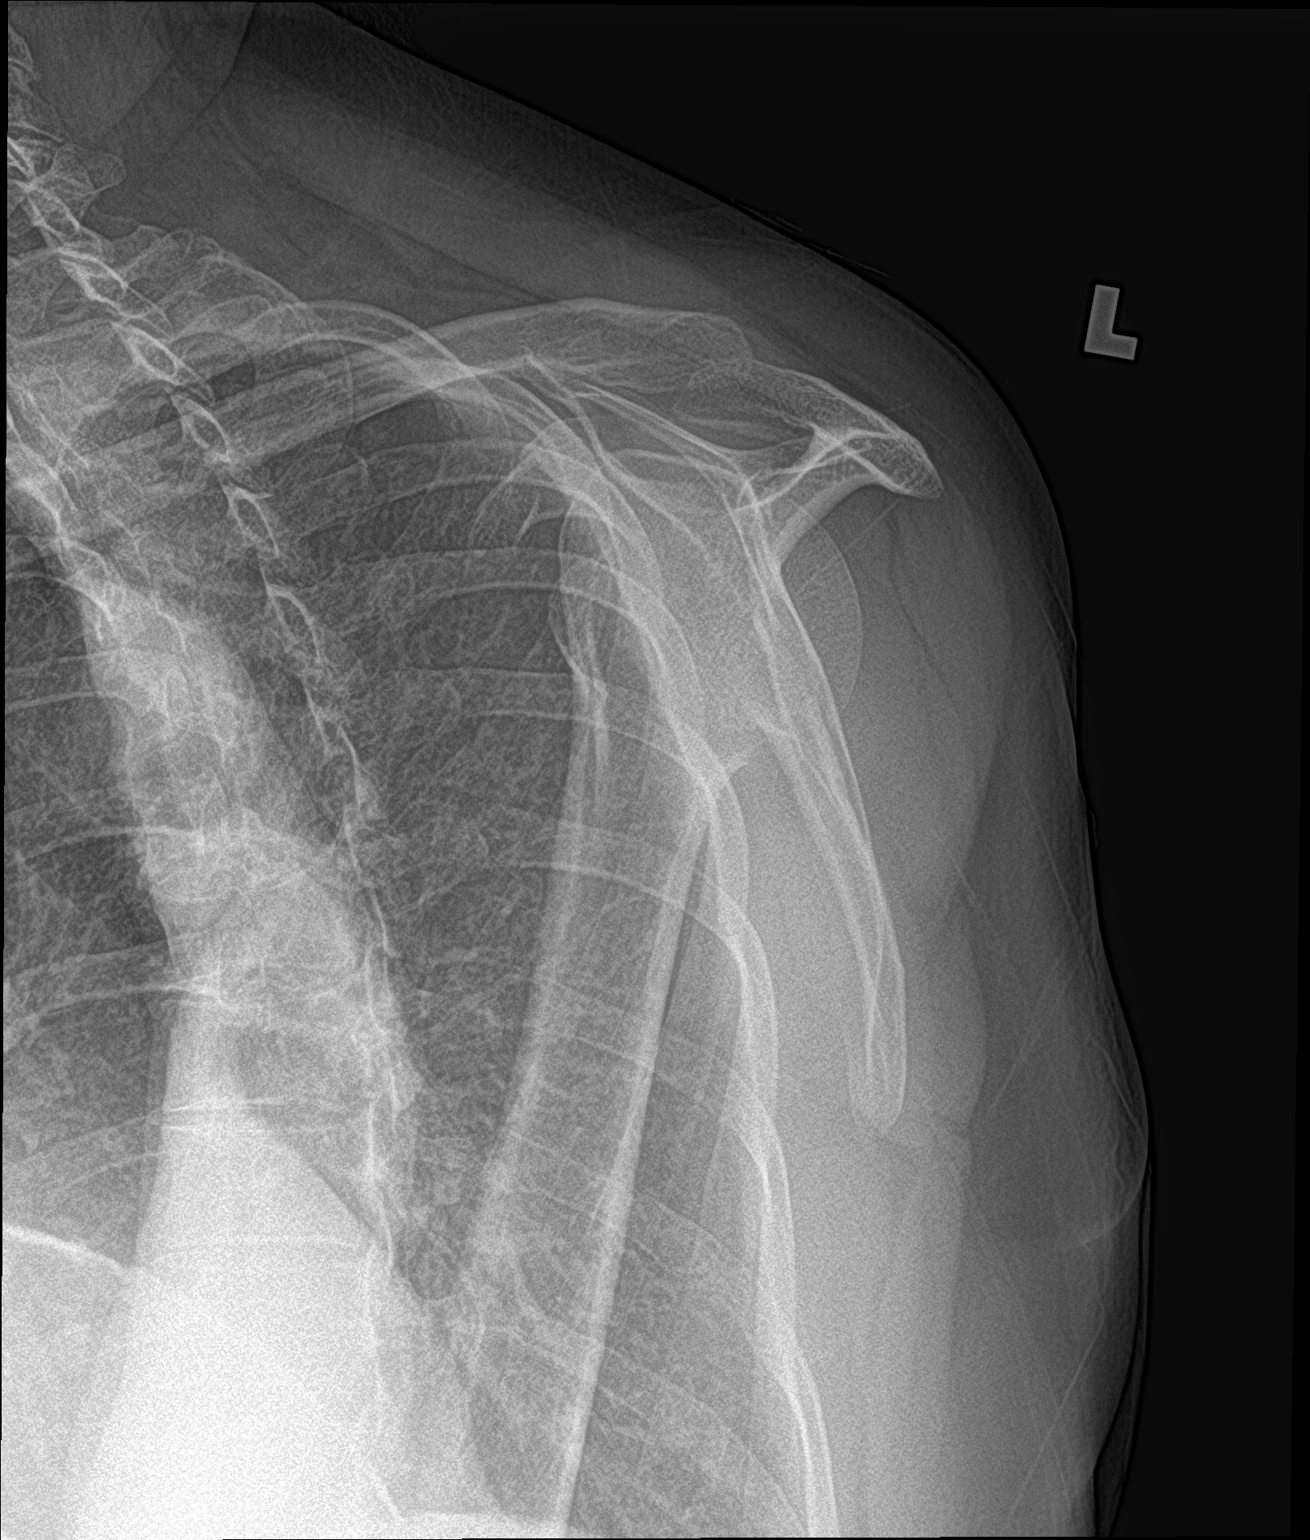

[shoulder axillary]
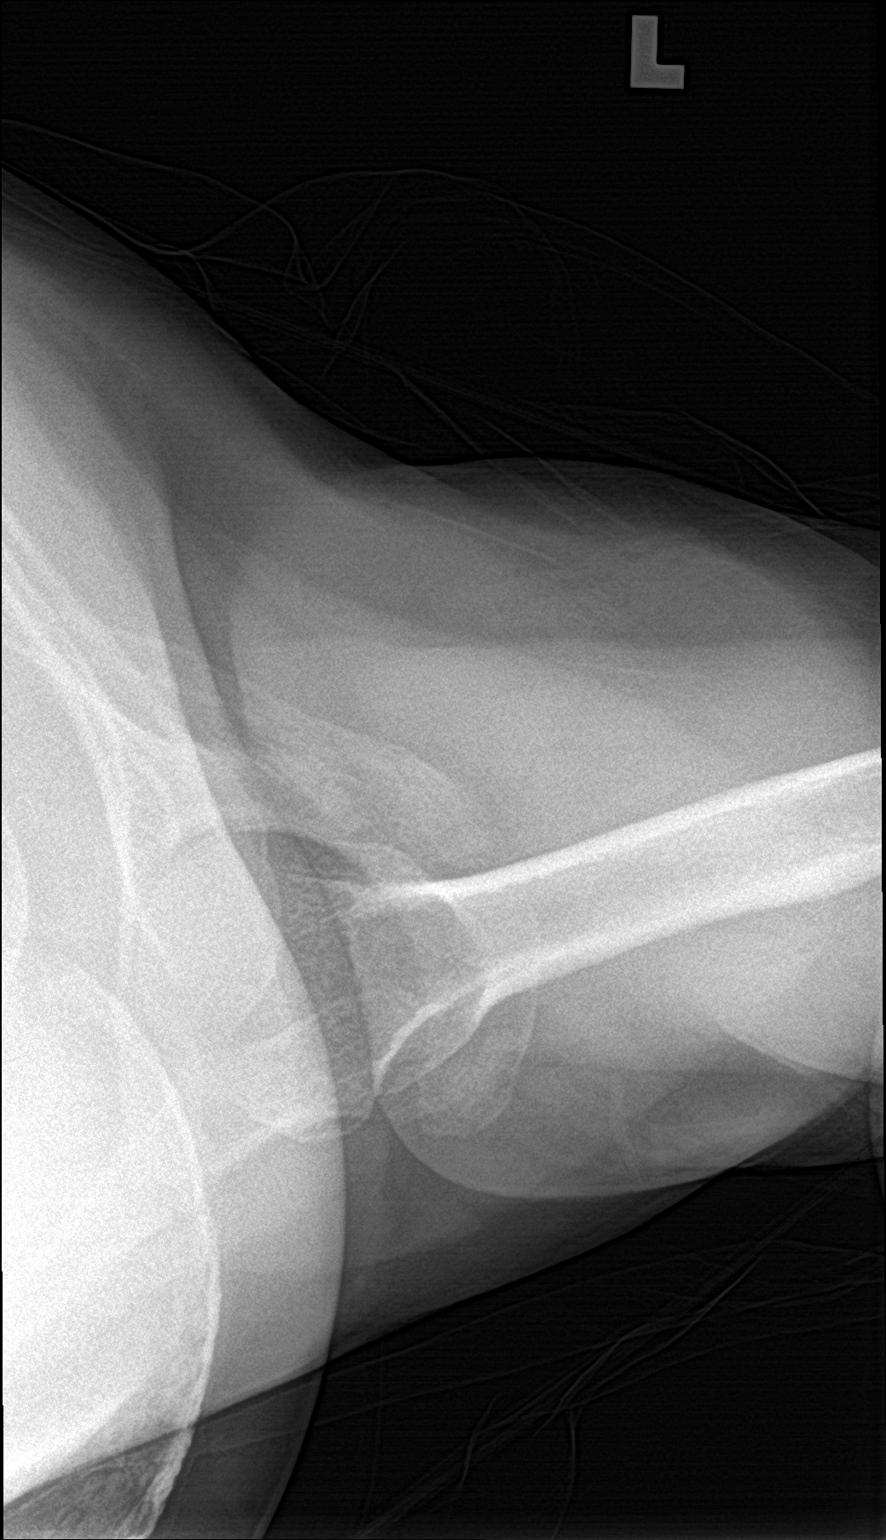

[3 of 3 positions shown; findings below may reference images not displayed]

FINDINGS: Frontal, transscapular, and axillary views of the left shoulder are
obtained. No fracture, subluxation, or dislocation. Joint spaces are
well preserved. Visualized portions of the left chest are clear.
IMPRESSION: 1. Unremarkable left shoulder.

## 2022-09-03 ENCOUNTER — Other Ambulatory Visit (HOSPITAL_COMMUNITY): Payer: Self-pay | Admitting: Psychiatry

## 2022-09-24 ENCOUNTER — Other Ambulatory Visit (HOSPITAL_COMMUNITY): Payer: Self-pay | Admitting: Psychiatry

## 2022-10-20 ENCOUNTER — Other Ambulatory Visit (HOSPITAL_COMMUNITY): Payer: Self-pay

## 2022-10-20 MED ORDER — GABAPENTIN 100 MG PO CAPS: ORAL_CAPSULE | ORAL | 0 refills | Status: DC

## 2022-11-01 ENCOUNTER — Telehealth (INDEPENDENT_AMBULATORY_CARE_PROVIDER_SITE_OTHER): Payer: Medicare Other | Admitting: Psychiatry

## 2022-11-01 ENCOUNTER — Encounter (HOSPITAL_COMMUNITY): Payer: Self-pay | Admitting: Psychiatry

## 2022-11-01 DIAGNOSIS — M797 Fibromyalgia: Secondary | ICD-10-CM

## 2022-11-01 DIAGNOSIS — F411 Generalized anxiety disorder: Secondary | ICD-10-CM | POA: Diagnosis not present

## 2022-11-01 DIAGNOSIS — F4310 Post-traumatic stress disorder, unspecified: Secondary | ICD-10-CM

## 2022-11-01 MED ORDER — GABAPENTIN 100 MG PO CAPS: ORAL_CAPSULE | ORAL | 1 refills | Status: DC

## 2022-11-01 NOTE — Progress Notes (Signed)
Patient ID: VELEKA DJORDJEVIC, female   DOB: 03/15/1961, 61 y.o.   MRN: [id number]  Martha Roberts   Patient Identification: Martha Roberts MRN:  [id number] Date of Evaluation:  11/01/2022 Referral Source: Dr. Tresa Endo , Primary care Chief Complaint:    depression follow up  Roberts Diagnosis:    ICD-10-CM   1. GAD (generalized anxiety disorder)  F41.1     2. Fibromyalgia  M79.7     3. PTSD (post-traumatic stress disorder)  F43.10      Virtual Roberts via Video Note  I connected with Martha Roberts on 11/01/22 at 10:00 AM EST by a video enabled telemedicine application and verified that I am speaking with the correct person using two identifiers.  Location: Patient: home Provider: home office   I discussed the limitations of evaluation and management by telemedicine and the availability of in person appointments. The patient expressed understanding and agreed to proceed.      I discussed the assessment and treatment plan with the patient. The patient was provided an opportunity to ask questions and all were answered. The patient agreed with the plan and demonstrated an understanding of the instructions.   The patient was advised to call back or seek an in-person evaluation if the symptoms worsen or if the condition fails to improve as anticipated.  I provided 15 - 20 minutes  of non-face-to-face time during this encounter.          History of Present Illness:   Gets lonely , fibro myalgia pain more so during winter time, tries to walk out and do stuff Overall gaba helps some Has grand kids to take care of at times and gives company   Panic attacks when goes outside, xanax helpls Gaba for fibro   Modifying factor : family, gardening Severity :  not worse, but avoids going out   previous Psychotropic Medications: Yes    Family Psychiatric History: sister, father. Has anxiety and panic attacks  Family History:  Family History  Problem Relation Age  of Onset   Cancer Mother    Diabetes Father    Anxiety disorder Father    Anxiety disorder Sister    Anxiety disorder Brother     Social History:   Social History   Socioeconomic History   Marital status: Divorced    Spouse name: Not on file   Number of children: Not on file   Years of education: Not on file   Highest education level: Not on file  Occupational History   Not on file  Tobacco Use   Smoking status: Every Day    Packs/day: 1.00    Years: 15.00    Total pack years: 15.00    Types: Cigarettes   Smokeless tobacco: Never   Tobacco comments:    patch, e-cigarettes  Vaping Use   Vaping Use: Never used  Substance and Sexual Activity   Alcohol use: No    Alcohol/week: 1.0 standard drink of alcohol    Types: 1 Glasses of wine per week   Drug use: No   Sexual activity: Not on file  Other Topics Concern   Not on file  Social History Narrative   ** Merged History Encounter **       ** Merged History Encounter **       Social Determinants of Health   Financial Resource Strain: Not on file  Food Insecurity: Not on file  Transportation Needs: Not on file  Physical Activity: Not  on file  Stress: Not on file  Social Connections: Not on file    Allergies:   Allergies  Allergen Reactions   Moxifloxacin Hives   Sulfa Antibiotics Hives   Amoxicillin-Pot Clavulanate Nausea And Vomiting    Metabolic Disorder Labs: No results found for: "HGBA1C", "MPG" No results found for: "PROLACTIN" No results found for: "CHOL", "TRIG", "HDL", "CHOLHDL", "VLDL", "LDLCALC"   Current Medications: Current Outpatient Medications  Medication Sig Dispense Refill   ALPRAZolam (XANAX) 0.5 MG tablet TAKE 1 TABLET BY MOUTH DAILY AS  NEEDED 30 tablet 0   atenolol (TENORMIN) 25 MG tablet Take by mouth.     buPROPion (WELLBUTRIN) 100 MG tablet TAKE 1 TABLET BY MOUTH DAILY 90 tablet 0   gabapentin (NEURONTIN) 100 MG capsule TAKE 1 CAPSULE(100 MG) BY MOUTH THREE TIMES DAILY 90  capsule 1   HYDROcodone-acetaminophen (NORCO/VICODIN) 5-325 MG tablet Take 1-2 tablets by mouth every 6 (six) hours as needed. 12 tablet 0   ibuprofen (ADVIL) 200 MG tablet Take 200 mg by mouth every 6 (six) hours as needed.     naproxen sodium (ANAPROX DS) 550 MG tablet Take 1 tablet (550 mg total) by mouth 2 (two) times daily with a meal. 30 tablet 0   omeprazole (PRILOSEC) 20 MG capsule TK 1 C PO QD  11   SYMBICORT 160-4.5 MCG/ACT inhaler INL 2 PFS PO BID  6   No current facility-administered medications for this Roberts.      Psychiatric Specialty Exam: Review of Systems  Cardiovascular:  Negative for chest pain.  Musculoskeletal:  Positive for myalgias.  Neurological:  Negative for tremors.  Psychiatric/Behavioral:  Negative for substance abuse and suicidal ideas.     There were no vitals taken for this Roberts.There is no height or weight on file to calculate BMI.  General Appearance: casual  Eye Contact:  fair  Speech:  Normal Rate  Volume:  Decreased  Mood:fair  Affect: congruent  Thought Process:  Coherent  Orientation:  Full (Time, Place, and Person)  Thought Content: clear  Suicidal Thoughts:  No  Homicidal Thoughts:  No  Memory:  Immediate;   Fair Recent;   Fair  Judgement:  Fair  Insight:  fair  Psychomotor Activity:  Normal  Concentration:  Concentration: Fair and Attention Span: Fair  Recall:  [employer] of Knowledge:Fair  Language: Fair  Akathisia:  Negative  Handed:  Right  AIMS (if indicated):    Assets:  Communication Skills Desire for Improvement Social Support  ADL's:  Intact  Cognition: WNL  Sleep:  fair    Treatment Plan Summary: Plan as folows Prior documentation reviewed   1. Depression: gets down at times during winter, continue wellbutrin #2 generalized anxiety disorder:fluctuates meds, keep some balance, continue xanax, gabapentin   3. Ptsd: baseline, continue working on distractions, xanax for agarophobia  Fu 58m. Follow up with  providers for medical co morbidities   Collaboration of Care: Other patient has follow up appointment with deramtologist to assess her hand condition or dermatitis  Patient/Guardian was advised Release of Information must be obtained prior to any record release in order to collaborate their care with an outside provider. Patient/Guardian was advised if they have not already done so to contact the registration department to sign all necessary forms in order for [place] to release information regarding their care.   Consent: Patient/Guardian gives verbal consent for treatment and assignment of benefits for services provided during this Roberts. Patient/Guardian expressed understanding and agreed to proceed.  Fu 2-75m.  Thresa Ross, MD 11/20/202310:13 AM

## 2022-12-01 ENCOUNTER — Other Ambulatory Visit (HOSPITAL_COMMUNITY): Payer: Self-pay | Admitting: Psychiatry

## 2022-12-08 ENCOUNTER — Other Ambulatory Visit (HOSPITAL_COMMUNITY): Payer: Self-pay | Admitting: Psychiatry

## 2022-12-08 ENCOUNTER — Telehealth (HOSPITAL_COMMUNITY): Payer: Self-pay

## 2022-12-08 DIAGNOSIS — F411 Generalized anxiety disorder: Secondary | ICD-10-CM

## 2022-12-08 MED ORDER — ALPRAZOLAM 0.5 MG PO TABS: 0.5000 mg | ORAL_TABLET | Freq: Every day | ORAL | 0 refills | Status: AC | PRN

## 2022-12-08 NOTE — Telephone Encounter (Signed)
Medication refill - Fax from patient's [facility] requesting a new Alprazolam 0.5 mg order, last provided on 09/06/22 and continued by Dr. Gilmore Laroche at last assessment 11/01/22 per record review. Patient returns next on 01/24/23.

## 2022-12-08 NOTE — Telephone Encounter (Signed)
I have send Xanax 0.5 mg daily as needed-limited supply to [facility] home delivery.  Will route this message to Dr. Gilmore Laroche to address once he is back in office.

## 2022-12-21 ENCOUNTER — Other Ambulatory Visit (HOSPITAL_COMMUNITY): Payer: Self-pay | Admitting: Psychiatry

## 2023-01-24 ENCOUNTER — Encounter (HOSPITAL_COMMUNITY): Payer: Self-pay | Admitting: Psychiatry

## 2023-01-24 ENCOUNTER — Telehealth (INDEPENDENT_AMBULATORY_CARE_PROVIDER_SITE_OTHER): Payer: 59 | Admitting: Psychiatry

## 2023-01-24 DIAGNOSIS — F341 Dysthymic disorder: Secondary | ICD-10-CM

## 2023-01-24 DIAGNOSIS — M797 Fibromyalgia: Secondary | ICD-10-CM

## 2023-01-24 DIAGNOSIS — F411 Generalized anxiety disorder: Secondary | ICD-10-CM

## 2023-01-24 DIAGNOSIS — F4310 Post-traumatic stress disorder, unspecified: Secondary | ICD-10-CM

## 2023-01-24 MED ORDER — ALPRAZOLAM 0.5 MG PO TABS: 0.5000 mg | ORAL_TABLET | Freq: Every day | ORAL | 0 refills | Status: DC | PRN

## 2023-01-24 NOTE — Progress Notes (Signed)
Patient ID: Martha Roberts, female   DOB: [birth year], 62 y.o.   MRN: [id number]  [facility] Outpatient Follow up visit   Patient Identification: Martha Roberts MRN:  [id number] Date of Evaluation:  01/24/2023 Referral Source: Dr. Claiborne Billings , Primary care Chief Complaint:    depression follow up  Visit Diagnosis:    ICD-10-CM   1. GAD (generalized anxiety disorder)  F41.1     2. Fibromyalgia  M79.7     3. PTSD (post-traumatic stress disorder)  F43.10     4. Dysthymia  F34.1      Virtual Visit via Video Note  I connected with Martha Roberts on 01/24/23 at  1:00 PM EST by a video enabled telemedicine application and verified that I am speaking with the correct person using two identifiers.  Location: Patient: home Provider: home office    I discussed the limitations of evaluation and management by telemedicine and the availability of in person appointments. The patient expressed understanding and agreed to proceed.     I discussed the assessment and treatment plan with the patient. The patient was provided an opportunity to ask questions and all were answered. The patient agreed with the plan and demonstrated an understanding of the instructions.   The patient was advised to call back or seek an in-person evaluation if the symptoms worsen or if the condition fails to improve as anticipated.  I provided 20 minutes of non-face-to-face time during this encounter.       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 Has anxiety and panic attacks  Family History:  Family History  Problem Relation Age of Onset   Cancer Mother     Diabetes Father    Anxiety disorder Father    Anxiety disorder Sister    Anxiety disorder Brother     Social History:   Social History   Socioeconomic History   Marital status: Divorced    Spouse name: Not on file   Number of children: Not on file   Years of education: Not on file   Highest education level: Not on file  Occupational History   Not on file  Tobacco Use   Smoking status: Every Day    Packs/day: 1.00    Years: 15.00    Total pack years: 15.00    Types: Cigarettes   Smokeless tobacco: Never   Tobacco comments:    patch, e-cigarettes  Vaping Use   Vaping Use: Never used  Substance and Sexual Activity   Alcohol use: No    Alcohol/week: 1.0 standard drink of alcohol    Types: 1 Glasses of wine per week   Drug use: No   Sexual activity: Not on file  Other Topics Concern   Not on file  Social History Narrative   ** Merged History Encounter **       ** Merged History Encounter **       Social Determinants of Health   Financial Resource Strain: Not on file  Food Insecurity: Not on file  Transportation Needs: Not on file  Physical Activity: Not on file  Stress: Not on file  Social Connections:  Not on file    Allergies:   Allergies  Allergen Reactions   Moxifloxacin Hives   Sulfa Antibiotics Hives   Amoxicillin-Pot Clavulanate Nausea And Vomiting    Metabolic Disorder Labs: No results found for: "HGBA1C", "MPG" No results found for: "PROLACTIN" No results found for: "CHOL", "TRIG", "HDL", "CHOLHDL", "VLDL", "LDLCALC"   Current Medications: Current Outpatient Medications  Medication Sig Dispense Refill   ALPRAZolam (XANAX) 0.5 MG tablet Take 1 tablet (0.5 mg total) by mouth daily as needed for anxiety. One a day 30 tablet 0   atenolol (TENORMIN) 25 MG tablet Take by mouth.     buPROPion (WELLBUTRIN) 100 MG tablet Take 1 tablet (100 mg total) by mouth daily. 100 tablet 0   gabapentin (NEURONTIN) 100 MG capsule TAKE 1 CAPSULE BY MOUTH 3 TIMES   DAILY 180 capsule 0   HYDROcodone-acetaminophen (NORCO/VICODIN) 5-325 MG tablet Take 1-2 tablets by mouth every 6 (six) hours as needed. 12 tablet 0   ibuprofen (ADVIL) 200 MG tablet Take 200 mg by mouth every 6 (six) hours as needed.     naproxen sodium (ANAPROX DS) 550 MG tablet Take 1 tablet (550 mg total) by mouth 2 (two) times daily with a meal. 30 tablet 0   omeprazole (PRILOSEC) 20 MG capsule TK 1 C PO QD  11   SYMBICORT 160-4.5 MCG/ACT inhaler INL 2 PFS PO BID  6   No current facility-administered medications for this visit.      Psychiatric Specialty Exam: Review of Systems  Cardiovascular:  Negative for chest pain.  Musculoskeletal:  Positive for myalgias.  Neurological:  Negative for tremors.  Psychiatric/Behavioral:  Negative for substance abuse and suicidal ideas.     There were no vitals taken for this visit.There is no height or weight on file to calculate BMI.  General Appearance: casual  Eye Contact:  fair  Speech:  Normal Rate  Volume:  Decreased  Mood:fair  Affect: congruent  Thought Process:  Coherent  Orientation:  Full (Time, Place, and Person)  Thought Content: clear  Suicidal Thoughts:  No  Homicidal Thoughts:  No  Memory:  Immediate;   Fair Recent;   Fair  Judgement:  Fair  Insight:  fair  Psychomotor Activity:  Normal  Concentration:  Concentration: Fair and Attention Span: Fair  Recall:  [employer] of Knowledge:Fair  Language: Fair  Akathisia:  Negative  Handed:  Right  AIMS (if indicated):    Assets:  Communication Skills Desire for Improvement Social Support  ADL's:  Intact  Cognition: WNL  Sleep:  fair    Treatment Plan Summary: Plan as folows  Prior documentation reviewed   1. Depression: manageable, continue wellbutrin  #2 generalized anxiety disorder:fluctuates, continue xanax prn, consider therapy    3. Ptsd: baseline, she plans to schedule therapy Continue xanax prn  Fu 34m Follow up with providers for medical co  morbidities   Collaboration of Care: Other patient has follow up appointment with deramtologist to assess her hand condition or dermatitis  Patient/Guardian was advised Release of Information must be obtained prior to any record release in order to collaborate their care with an outside provider. Patient/Guardian was advised if they have not already done so to contact the registration department to sign all necessary forms in order for [place] release information regarding their care.   Consent: Patient/Guardian gives verbal consent for treatment and assignment of benefits for services provided during this visit. Patient/Guardian expressed understanding and agreed to proceed.  Fu 2-50m  NMerian Capron MD 2/12/20241:09 PMPatient ID: LMadie Reno female   DOB: 227-Oct-1962 62y.o.   MRN: [id number]

## 2023-02-03 ENCOUNTER — Other Ambulatory Visit (HOSPITAL_COMMUNITY): Payer: Self-pay | Admitting: Psychiatry

## 2023-02-12 ENCOUNTER — Other Ambulatory Visit (HOSPITAL_COMMUNITY): Payer: Self-pay | Admitting: Psychiatry

## 2023-03-28 ENCOUNTER — Encounter (HOSPITAL_COMMUNITY): Payer: Self-pay | Admitting: Psychiatry

## 2023-03-28 ENCOUNTER — Telehealth (INDEPENDENT_AMBULATORY_CARE_PROVIDER_SITE_OTHER): Payer: 59 | Admitting: Psychiatry

## 2023-03-28 DIAGNOSIS — F411 Generalized anxiety disorder: Secondary | ICD-10-CM

## 2023-03-28 DIAGNOSIS — F4310 Post-traumatic stress disorder, unspecified: Secondary | ICD-10-CM

## 2023-03-28 DIAGNOSIS — F341 Dysthymic disorder: Secondary | ICD-10-CM | POA: Diagnosis not present

## 2023-03-28 DIAGNOSIS — M797 Fibromyalgia: Secondary | ICD-10-CM

## 2023-03-28 NOTE — Progress Notes (Signed)
Patient ID: CERENITI DETEMPLE, female   DOB: 02-13-1961, 62 y.o.   MRN: [id number]  [facility] Outpatient Follow up visit   Patient Identification: Martha Roberts MRN:  [id number] Date of Evaluation:  03/28/2023 Referral Source: Dr. Tresa Endo , Primary care Chief Complaint:    depression follow up  Visit Diagnosis:    ICD-10-CM   1. GAD (generalized anxiety disorder)  F41.1     2. PTSD (post-traumatic stress disorder)  F43.10     3. Fibromyalgia  M79.7     4. Dysthymia  F34.1      Virtual Visit via Video Note  I connected with Martha Roberts on 03/28/23 at  2:30 PM EDT by a video enabled telemedicine application and verified that I am speaking with the correct person using two identifiers.  Location: Patient: home Provider: home office   I discussed the limitations of evaluation and management by telemedicine and the availability of in person appointments. The patient expressed understanding and agreed to proceed.        I discussed the assessment and treatment plan with the patient. The patient was provided an opportunity to ask questions and all were answered. The patient agreed with the plan and demonstrated an understanding of the instructions.   The patient was advised to call back or seek an in-person evaluation if the symptoms worsen or if the condition fails to improve as anticipated.  I provided 15 - 20  minutes of non-face-to-face time during this encounter.    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 Has  anxiety and panic attacks  Family History:  Family History  Problem Relation Age of Onset   Cancer Mother    Diabetes Father    Anxiety disorder Father    Anxiety disorder Sister    Anxiety disorder Brother     Social History:   Social History   Socioeconomic History   Marital status: Divorced    Spouse name: Not on file   Number of children: Not on file   Years of education: Not on file   Highest education level: Not on file  Occupational History   Not on file  Tobacco Use   Smoking status: Every Day    Packs/day: 1.00    Years: 15.00    Additional pack years: 0.00    Total pack years: 15.00    Types: Cigarettes   Smokeless tobacco: Never   Tobacco comments:    patch, e-cigarettes  Vaping Use   Vaping Use: Never used  Substance and Sexual Activity   Alcohol use: No    Alcohol/week: 1.0 standard drink of alcohol    Types: 1 Glasses of wine per week   Drug use: No   Sexual activity: Not on file  Other Topics Concern   Not on file  Social History Narrative   ** Merged History Encounter **       ** Merged History Encounter **       Social Determinants of Health  Financial Resource Strain: Not on file  Food Insecurity: Not on file  Transportation Needs: Not on file  Physical Activity: Not on file  Stress: Not on file  Social Connections: Not on file    Allergies:   Allergies  Allergen Reactions   Moxifloxacin Hives   Sulfa Antibiotics Hives   Amoxicillin-Pot Clavulanate Nausea And Vomiting    Metabolic Disorder Labs: No results found for: "HGBA1C", "MPG" No results found for: "PROLACTIN" No results found for: "CHOL", "TRIG", "HDL", "CHOLHDL", "VLDL", "LDLCALC"   Current Medications: Current Outpatient Medications  Medication Sig Dispense Refill   ALPRAZolam (XANAX) 0.5 MG tablet Take 1 tablet (0.5 mg total) by mouth daily as needed for anxiety. One a day 30 tablet 0   atenolol (TENORMIN) 25 MG tablet Take by mouth.     buPROPion (WELLBUTRIN)  100 MG tablet TAKE 1 TABLET BY MOUTH DAILY 100 tablet 1   gabapentin (NEURONTIN) 100 MG capsule TAKE 1 CAPSULE BY MOUTH 3 TIMES  DAILY 180 capsule 0   HYDROcodone-acetaminophen (NORCO/VICODIN) 5-325 MG tablet Take 1-2 tablets by mouth every 6 (six) hours as needed. 12 tablet 0   ibuprofen (ADVIL) 200 MG tablet Take 200 mg by mouth every 6 (six) hours as needed.     naproxen sodium (ANAPROX DS) 550 MG tablet Take 1 tablet (550 mg total) by mouth 2 (two) times daily with a meal. 30 tablet 0   omeprazole (PRILOSEC) 20 MG capsule TK 1 C PO QD  11   SYMBICORT 160-4.5 MCG/ACT inhaler INL 2 PFS PO BID  6   No current facility-administered medications for this visit.      Psychiatric Specialty Exam: Review of Systems  Musculoskeletal:  Positive for myalgias.  Neurological:  Negative for tremors.  Psychiatric/Behavioral:  Negative for substance abuse and suicidal ideas.     There were no vitals taken for this visit.There is no height or weight on file to calculate BMI.  General Appearance: casual  Eye Contact:  fair  Speech:  Normal Rate  Volume:  Decreased  Mood:fair  Affect: congruent  Thought Process:  Coherent  Orientation:  Full (Time, Place, and Person)  Thought Content: clear  Suicidal Thoughts:  No  Homicidal Thoughts:  No  Memory:  Immediate;   Fair Recent;   Fair  Judgement:  Fair  Insight:  fair  Psychomotor Activity:  Normal  Concentration:  Concentration: Fair and Attention Span: Fair  Recall:  [employer] of Knowledge:Fair  Language: Fair  Akathisia:  Negative  Handed:  Right  AIMS (if indicated):    Assets:  Communication Skills Desire for Improvement Social Support  ADL's:  Intact  Cognition: WNL  Sleep:  fair    Treatment Plan Summary: Plan as folows  Prior documentation reviewed   1. Depression: fair continue wellbutrin   #2 generalized anxiety disorder: gets stressed out, xanax prn helps, continue gaba regularly rather then prn   3. Ptsd:  baseline, will continue meds, gaba and consider therapy  Fu 49m. Follow up with providers for medical co morbidities   Collaboration of Care: Other patient has follow up appointment with deramtologist to assess her hand condition or dermatitis  Patient/Guardian was advised Release of Information must be obtained prior to any record release in order to collaborate their care with an outside provider. Patient/Guardian was advised if they have not already done so to contact the registration department to sign all necessary forms in order for [place] to release information regarding their care.  Consent: Patient/Guardian gives verbal consent for treatment and assignment of benefits for services provided during this visit. Patient/Guardian expressed understanding and agreed to proceed.    Fu 2-63m.  Thresa Ross, MD 4/15/20242:44 PM

## 2023-05-30 ENCOUNTER — Encounter (HOSPITAL_COMMUNITY): Payer: Self-pay | Admitting: Psychiatry

## 2023-05-30 ENCOUNTER — Telehealth (INDEPENDENT_AMBULATORY_CARE_PROVIDER_SITE_OTHER): Payer: 59 | Admitting: Psychiatry

## 2023-05-30 DIAGNOSIS — F341 Dysthymic disorder: Secondary | ICD-10-CM | POA: Diagnosis not present

## 2023-05-30 DIAGNOSIS — F4310 Post-traumatic stress disorder, unspecified: Secondary | ICD-10-CM

## 2023-05-30 DIAGNOSIS — F411 Generalized anxiety disorder: Secondary | ICD-10-CM

## 2023-05-30 DIAGNOSIS — M797 Fibromyalgia: Secondary | ICD-10-CM | POA: Diagnosis not present

## 2023-05-30 MED ORDER — GABAPENTIN 100 MG PO CAPS: ORAL_CAPSULE | ORAL | 0 refills | Status: DC

## 2023-05-30 NOTE — Progress Notes (Signed)
Patient ID: Martha Roberts, female   DOB: January 13, 1961, 62 y.o.   MRN: [id number]  [facility] Outpatient Follow up visit   Patient Identification: Martha Roberts MRN:  [id number] Date of Evaluation:  05/30/2023 Referral Source: Dr. Tresa Endo , Primary care Chief Complaint:    depression follow up  Visit Diagnosis:    ICD-10-CM   1. GAD (generalized anxiety disorder)  F41.1     2. PTSD (post-traumatic stress disorder)  F43.10     3. Fibromyalgia  M79.7     4. Dysthymia  F34.1      Virtual Visit via Video Note  I connected with OLA Roberts on 05/30/23 at  4:30 PM EDT by a video enabled telemedicine application and verified that I am speaking with the correct person using two identifiers.  Location: Patient: home Provider: home office   I discussed the limitations of evaluation and management by telemedicine and the availability of in person appointments. The patient expressed understanding and agreed to proceed.     I discussed the assessment and treatment plan with the patient. The patient was provided an opportunity to ask questions and all were answered. The patient agreed with the plan and demonstrated an understanding of the instructions.   The patient was advised to call back or seek an in-person evaluation if the symptoms worsen or if the condition fails to improve as anticipated.  I provided 20 minutes of non-face-to-face time during this encounter.    History of Present Illness:    Doing fair gets amotivated , has sister who helps out Lives alone. Gets dizzy at times, have reduced gaba to night at taking 200 not 300mg  Has agarophobia, xanax helps when needed .    Panic attacks when goes outside, xanax helpls Gaba is for fibro as well Depression fair  Modifying factor : gardening Severity :  manageable   previous Psychotropic Medications: Yes    Family Psychiatric History: sister, father. Has anxiety and panic attacks  Family History:  Family History   Problem Relation Age of Onset   Cancer Mother    Diabetes Father    Anxiety disorder Father    Anxiety disorder Sister    Anxiety disorder Brother     Social History:   Social History   Socioeconomic History   Marital status: Divorced    Spouse name: Not on file   Number of children: Not on file   Years of education: Not on file   Highest education level: Not on file  Occupational History   Not on file  Tobacco Use   Smoking status: Every Day    Packs/day: 1.00    Years: 15.00    Additional pack years: 0.00    Total pack years: 15.00    Types: Cigarettes   Smokeless tobacco: Never   Tobacco comments:    patch, e-cigarettes  Vaping Use   Vaping Use: Never used  Substance and Sexual Activity   Alcohol use: No    Alcohol/week: 1.0 standard drink of alcohol    Types: 1 Glasses of wine per week   Drug use: No   Sexual activity: Not on file  Other Topics Concern   Not on file  Social History Narrative   ** Merged History Encounter **       ** Merged History Encounter **       Social Determinants of Health   Financial Resource Strain: Not on file  Food Insecurity: Not on file  Transportation Needs: Not on  file  Physical Activity: Not on file  Stress: Not on file  Social Connections: Not on file    Allergies:   Allergies  Allergen Reactions   Moxifloxacin Hives   Sulfa Antibiotics Hives   Amoxicillin-Pot Clavulanate Nausea And Vomiting    Metabolic Disorder Labs: No results found for: "HGBA1C", "MPG" No results found for: "PROLACTIN" No results found for: "CHOL", "TRIG", "HDL", "CHOLHDL", "VLDL", "LDLCALC"   Current Medications: Current Outpatient Medications  Medication Sig Dispense Refill   ALPRAZolam (XANAX) 0.5 MG tablet Take 1 tablet (0.5 mg total) by mouth daily as needed for anxiety. One a day 30 tablet 0   atenolol (TENORMIN) 25 MG tablet Take by mouth.     buPROPion (WELLBUTRIN) 100 MG tablet TAKE 1 TABLET BY MOUTH DAILY 100 tablet 1    gabapentin (NEURONTIN) 100 MG capsule TAKE 2 at night 120 capsule 0   HYDROcodone-acetaminophen (NORCO/VICODIN) 5-325 MG tablet Take 1-2 tablets by mouth every 6 (six) hours as needed. 12 tablet 0   ibuprofen (ADVIL) 200 MG tablet Take 200 mg by mouth every 6 (six) hours as needed.     naproxen sodium (ANAPROX DS) 550 MG tablet Take 1 tablet (550 mg total) by mouth 2 (two) times daily with a meal. 30 tablet 0   omeprazole (PRILOSEC) 20 MG capsule TK 1 C PO QD  11   SYMBICORT 160-4.5 MCG/ACT inhaler INL 2 PFS PO BID  6   No current facility-administered medications for this visit.      Psychiatric Specialty Exam: Review of Systems  Musculoskeletal:  Positive for myalgias.  Neurological:  Negative for tremors.  Psychiatric/Behavioral:  Negative for substance abuse and suicidal ideas.     There were no vitals taken for this visit.There is no height or weight on file to calculate BMI.  General Appearance: casual  Eye Contact:  fair  Speech:  Normal Rate  Volume:  Decreased  Mood:fair  Affect: congruent  Thought Process:  Coherent  Orientation:  Full (Time, Place, and Person)  Thought Content: clear  Suicidal Thoughts:  No  Homicidal Thoughts:  No  Memory:  Immediate;   Fair Recent;   Fair  Judgement:  Fair  Insight:  fair  Psychomotor Activity:  Normal  Concentration:  Concentration: Fair and Attention Span: Fair  Recall:  [employer] of Knowledge:Fair  Language: Fair  Akathisia:  Negative  Handed:  Right  AIMS (if indicated):    Assets:  Communication Skills Desire for Improvement Social Support  ADL's:  Intact  Cognition: WNL  Sleep:  fair    Treatment Plan Summary: Plan as folows  Prior documentation reviewed   1. Depression: fair continue wellbutirn, add activities to keep busy #2 generalized anxiety disorder: manageable, continue gaba and xanax    3. Ptsd: baseline, continue gabapentin  Work on distractions and coping skills  Fu 64m. Follow up with  providers for medical co morbidities   Collaboration of Care: Other patient has follow up appointment with deramtologist to assess her hand condition or dermatitis  Patient/Guardian was advised Release of Information must be obtained prior to any record release in order to collaborate their care with an outside provider. Patient/Guardian was advised if they have not already done so to contact the registration department to sign all necessary forms in order for [place] to release information regarding their care.   Consent: Patient/Guardian gives verbal consent for treatment and assignment of benefits for services provided during this visit. Patient/Guardian expressed understanding and agreed  to proceed.    Fu 2-1m.  Thresa Ross, MD 6/17/20244:35 PM

## 2023-06-28 ENCOUNTER — Other Ambulatory Visit (HOSPITAL_COMMUNITY): Payer: Self-pay | Admitting: Psychiatry

## 2023-07-01 ENCOUNTER — Telehealth (HOSPITAL_COMMUNITY): Payer: Self-pay | Admitting: Psychiatry

## 2023-07-01 NOTE — Telephone Encounter (Signed)
Patient called requesting refill of: ALPRAZolam Prudy Feeler) 0.5 MG tablet   [facility] - [place], [place] - [address] (Ph: [phone])   Last ordered: 06/29/2023  Last visit: 05/30/2023  Next visit: 08/31/2023  Called patient and informed her the record reflects  that order had been sent 06/29/2023. She stated she received notification from the pharmacy yesterday 06/30/2023 that refill request had not been received. She will follow up with the pharmacy as well.

## 2023-07-15 ENCOUNTER — Telehealth (HOSPITAL_COMMUNITY): Payer: Self-pay

## 2023-07-15 MED ORDER — ALPRAZOLAM 0.5 MG PO TABS: 0.5000 mg | ORAL_TABLET | Freq: Every day | ORAL | 0 refills | Status: DC | PRN

## 2023-07-15 NOTE — Telephone Encounter (Signed)
Medication refill - Fax from patient's [facility] for a new Alprazolam order, last provided 06/29/23 with no refills and patient returns next on 08/31/23. [employer] requesting order to send out when 06/29/23 order is running out.

## 2023-07-28 ENCOUNTER — Telehealth (HOSPITAL_COMMUNITY): Payer: Self-pay | Admitting: *Deleted

## 2023-07-28 MED ORDER — ALPRAZOLAM 0.5 MG PO TABS: 0.5000 mg | ORAL_TABLET | Freq: Every day | ORAL | 0 refills | Status: DC | PRN

## 2023-07-28 NOTE — Addendum Note (Signed)
Addended by: Thresa Ross on: 07/28/2023 10:00 AM   Modules accepted: Orders

## 2023-07-28 NOTE — Telephone Encounter (Signed)
PATIENT REFILL REQUEST--  [facility] HOME DELIVERY OUT OF STOCK  [facility] [phone] - [place], [place] - [address]    Disp Refills Start End   ALPRAZolam (XANAX) 0.5 MG tablet 30 tablet 0 07/15/2023 --   Sig - Route: Take 1 tablet (0.5 mg total)  by mouth daily as needed for anxiety     NEXT APPT--08/31/23 LAST APPT -- 05/30/23

## 2023-08-31 ENCOUNTER — Telehealth (HOSPITAL_COMMUNITY): Payer: 59 | Admitting: Psychiatry

## 2023-11-09 ENCOUNTER — Telehealth (HOSPITAL_COMMUNITY): Payer: 59 | Admitting: Psychiatry

## 2023-11-09 ENCOUNTER — Encounter (HOSPITAL_COMMUNITY): Payer: Self-pay | Admitting: Psychiatry

## 2023-11-09 DIAGNOSIS — F411 Generalized anxiety disorder: Secondary | ICD-10-CM | POA: Diagnosis not present

## 2023-11-09 DIAGNOSIS — M797 Fibromyalgia: Secondary | ICD-10-CM | POA: Diagnosis not present

## 2023-11-09 DIAGNOSIS — F341 Dysthymic disorder: Secondary | ICD-10-CM

## 2023-11-09 DIAGNOSIS — F4310 Post-traumatic stress disorder, unspecified: Secondary | ICD-10-CM

## 2023-11-09 NOTE — Progress Notes (Signed)
Patient ID: RYANE BARLOW, female   DOB: 27-Dec-1960, 62 y.o.   MRN: [id number]  [facility] Outpatient Follow up visit   Patient Identification: Martha Roberts MRN:  [id number] Date of Evaluation:  11/09/2023 Referral Source: Dr. Tresa Endo , Primary care Chief Complaint:    depression follow up  Visit Diagnosis:    ICD-10-CM   1. GAD (generalized anxiety disorder)  F41.1     2. PTSD (post-traumatic stress disorder)  F43.10     3. Fibromyalgia  M79.7     4. Dysthymia  F34.1     Virtual Visit via Video Note  I connected with Martha Roberts on 11/09/23 at  8:30 AM EST by a video enabled telemedicine application and verified that I am speaking with the correct person using two identifiers.  Location: Patient: home Provider: home office   I discussed the limitations of evaluation and management by telemedicine and the availability of in person appointments. The patient expressed understanding and agreed to proceed.      I discussed the assessment and treatment plan with the patient. The patient was provided an opportunity to ask questions and all were answered. The patient agreed with the plan and demonstrated an understanding of the instructions.   The patient was advised to call back or seek an in-person evaluation if the symptoms worsen or if the condition fails to improve as anticipated.  I provided 20 minutes of non-face-to-face time during this encounter.     History of Present Illness:    Lives alone, gets lonely, hs a sister to check on her  Overall doing fair with wellbutrin does not want to increase med Says suffering from cold so feeling week otherwise fair Takes xanax prn for anxiety  Gaba for fibro  Modifying factor : gardening when she can  Severity :  manageable   previous Psychotropic Medications: Yes    Family Psychiatric History: sister, father. Has anxiety and panic attacks  Family History:  Family History  Problem Relation Age of Onset   Cancer  Mother    Diabetes Father    Anxiety disorder Father    Anxiety disorder Sister    Anxiety disorder Brother     Social History:   Social History   Socioeconomic History   Marital status: Divorced    Spouse name: Not on file   Number of children: Not on file   Years of education: Not on file   Highest education level: Not on file  Occupational History   Not on file  Tobacco Use   Smoking status: Every Day    Current packs/day: 1.00    Average packs/day: 1 pack/day for 15.0 years (15.0 ttl pk-yrs)    Types: Cigarettes   Smokeless tobacco: Never   Tobacco comments:    patch, e-cigarettes  Vaping Use   Vaping status: Never Used  Substance and Sexual Activity   Alcohol use: No    Alcohol/week: 1.0 standard drink of alcohol    Types: 1 Glasses of wine per week   Drug use: No   Sexual activity: Not on file  Other Topics Concern   Not on file  Social History Narrative   ** Merged History Encounter **       ** Merged History Encounter **       Social Determinants of Health   Financial Resource Strain: Low Risk  (03/23/2023)   Received from [employer], [facility]   Overall Financial Resource Strain (CARDIA)    Difficulty of  Paying Living Expenses: Not very hard  Food Insecurity: Food Insecurity Present (03/23/2023)   Received from [facility], [facility]   Hunger Vital Sign    Worried About Running Out of Food in the Last Year: Sometimes true    Ran Out of Food in the Last Year: Sometimes true  Transportation Needs: Unmet Transportation Needs (03/23/2023)   Received from [facility], [facility]   PRAPARE - Transportation    Lack of Transportation (Medical): Yes    Lack of Transportation (Non-Medical): Yes  Physical Activity: Unknown (03/23/2023)   Received from [facility], [facility]   Exercise Vital Sign    Days of Exercise per Week: Patient declined    Minutes of Exercise per Session: Not on file  Stress: Stress Concern Present (03/23/2023)    Received from [facility], [facility] - Occupational Stress Questionnaire    Feeling of Stress : To some extent  Social Connections: Socially Isolated (03/23/2023)   Received from [facility], [facility]   Social Network    How would you rate your social network (family, work, friends)?: Little participation, lonely and socially isolated    Allergies:   Allergies  Allergen Reactions   Moxifloxacin Hives   Sulfa Antibiotics Hives   Amoxicillin-Pot Clavulanate Nausea And Vomiting    Metabolic Disorder Labs: No results found for: "HGBA1C", "MPG" No results found for: "PROLACTIN" No results found for: "CHOL", "TRIG", "HDL", "CHOLHDL", "VLDL", "LDLCALC"   Current Medications: Current Outpatient Medications  Medication Sig Dispense Refill   ALPRAZolam (XANAX) 0.5 MG tablet Take 1 tablet (0.5 mg total) by mouth daily as needed for anxiety. 30 tablet 0   atenolol (TENORMIN) 25 MG tablet Take by mouth.     buPROPion (WELLBUTRIN) 100 MG tablet TAKE 1 TABLET BY MOUTH DAILY 100 tablet 2   gabapentin (NEURONTIN) 100 MG capsule TAKE 2 at night 120 capsule 0   HYDROcodone-acetaminophen (NORCO/VICODIN) 5-325 MG tablet Take 1-2 tablets by mouth every 6 (six) hours as needed. 12 tablet 0   ibuprofen (ADVIL) 200 MG tablet Take 200 mg by mouth every 6 (six) hours as needed.     naproxen sodium (ANAPROX DS) 550 MG tablet Take 1 tablet (550 mg total) by mouth 2 (two) times daily with a meal. 30 tablet 0   omeprazole (PRILOSEC) 20 MG capsule TK 1 C PO QD  11   SYMBICORT 160-4.5 MCG/ACT inhaler INL 2 PFS PO BID  6   No current facility-administered medications for this visit.      Psychiatric Specialty Exam: Review of Systems  Musculoskeletal:  Positive for myalgias.  Neurological:  Negative for tremors.  Psychiatric/Behavioral:  Negative for substance abuse and suicidal ideas.     There were no vitals taken for this visit.There is no height  or weight on file to calculate BMI.  General Appearance: casual  Eye Contact:  fair  Speech:  Normal Rate  Volume:  Decreased  Mood:fair  Affect: congruent  Thought Process:  Coherent  Orientation:  Full (Time, Place, and Person)  Thought Content: clear  Suicidal Thoughts:  No  Homicidal Thoughts:  No  Memory:  Immediate;   Fair Recent;   Fair  Judgement:  Fair  Insight:  fair  Psychomotor Activity:  Normal  Concentration:  Concentration: Fair and Attention Span: Fair  Recall:  [employer] of Knowledge:Fair  Language: Fair  Akathisia:  Negative  Handed:  Right  AIMS (if indicated):  Assets:  Communication Skills Desire for Improvement Social Support  ADL's:  Intact  Cognition: WNL  Sleep:  fair    Treatment Plan Summary: Plan as folows  Prior documentation reviewed   1. Depression: manageable continue wellbutrin 2. GAD: baseline , circumstances add stressors , continue coping skills, she states is fine with xanax prn  3. Ptsd: baseline, continue distraction from worries, xanax prn  Fu 16m. Follow up with providers for medical co morbidities   Collaboration of Care: Other patient has follow up appointment with deramtologist to assess her hand condition or dermatitis  Patient/Guardian was advised Release of Information must be obtained prior to any record release in order to collaborate their care with an outside provider. Patient/Guardian was advised if they have not already done so to contact the registration department to sign all necessary forms in order for [place] to release information regarding their care.   Consent: Patient/Guardian gives verbal consent for treatment and assignment of benefits for services provided during this visit. Patient/Guardian expressed understanding and agreed to proceed.    Fu 2-6m.  Thresa Ross, MD 11/27/20248:29 AM

## 2023-11-15 DIAGNOSIS — G4710 Hypersomnia, unspecified: Secondary | ICD-10-CM | POA: Diagnosis not present

## 2023-11-15 DIAGNOSIS — F17210 Nicotine dependence, cigarettes, uncomplicated: Secondary | ICD-10-CM | POA: Diagnosis not present

## 2023-11-16 DIAGNOSIS — R1012 Left upper quadrant pain: Secondary | ICD-10-CM | POA: Diagnosis not present

## 2023-11-16 DIAGNOSIS — D72829 Elevated white blood cell count, unspecified: Secondary | ICD-10-CM | POA: Diagnosis not present

## 2023-11-17 DIAGNOSIS — R1012 Left upper quadrant pain: Secondary | ICD-10-CM | POA: Diagnosis not present

## 2023-11-23 ENCOUNTER — Other Ambulatory Visit: Payer: Self-pay

## 2023-11-23 ENCOUNTER — Ambulatory Visit
Admission: EM | Admit: 2023-11-23 | Discharge: 2023-11-23 | Disposition: A | Discharge status: Home / Self Care | Payer: 59 | Attending: Family Medicine | Admitting: Family Medicine

## 2023-11-23 DIAGNOSIS — H66004 Acute suppurative otitis media without spontaneous rupture of ear drum, recurrent, right ear: Secondary | ICD-10-CM

## 2023-11-23 DIAGNOSIS — K5909 Other constipation: Secondary | ICD-10-CM

## 2023-11-23 DIAGNOSIS — R1012 Left upper quadrant pain: Secondary | ICD-10-CM | POA: Diagnosis not present

## 2023-11-23 MED ORDER — CEFDINIR 300 MG PO CAPS: 300.0000 mg | ORAL_CAPSULE | Freq: Two times a day (BID) | ORAL | 0 refills | Status: DC

## 2023-11-23 MED ORDER — TRAMADOL HCL 50 MG PO TABS: 50.0000 mg | ORAL_TABLET | Freq: Four times a day (QID) | ORAL | 0 refills | Status: DC | PRN

## 2023-11-23 NOTE — Discharge Instructions (Addendum)
I am prescribing cefdinir to take for your ear infection I am hoping this will help with your colon inflammation Your x-ray showed constipation.  As I reviewed your chart it looks like this is a chronic problem.  You need to take a daily fiber supplement to regulate your bowels You may need to see your gastroenterologist if this pain persists May take tramadol as needed for pain

## 2023-11-23 NOTE — ED Triage Notes (Signed)
Left upper abdominal pain x 3 weeks, radiates into back at times. Laying on left side used to help but doesn't anymore. Has seen physician for this twice, was told it was acid reflux the first time (had blood work which showed elevated wbc and liver enzymes). Has not been taking otc medications for pain. Has had some intermittent nausea for a week. No vomiting.

## 2023-11-23 NOTE — ED Provider Notes (Signed)
[name]    CSN: [id number] Arrival date & time: 11/23/23  0935      History   Chief Complaint Chief Complaint  Patient presents with   Abdominal Pain    HPI Martha Roberts is a 62 y.o. female.   HPI  This is a complicated patient with multiple medical problems who is here for evaluation of left-sided abdominal pain.  This is her third medical visit for this pain.  She has seen her PCP twice.  She states x-rays were done and she does not know the results.  She also states that blood work was done and she had elevated liver function test.  Her doctor told her not to take ibuprofen or Tylenol, so she does not know what to do for this pain. Patient states she has had a colonoscopy before and it was negative.  I cannot find these results in her old chart And the old record I did find patient had been seen by gastroenterology in 2022.  They commented on her alternating constipation and diarrhea, feeling like she had chronic constipation with diarrhea overflow.  Patient seems to think she has chronic diarrhea and whenever she is going to leave the house she takes Imodium.  She does not understand the mechanics of her colon function and disorder. Patient states she has agoraphobia and hardly leaves the house.  She also has fibromyalgia and chronic pain. She states the left-sided abdominal pain has been worsening over 3 weeks.  It is very crampy and sore.  It hurts with movement cough and deep breath.  Appetite is diminished.  No nausea or vomiting.  She states she has intermittent diarrhea.  She had an x-ray performed on 11/17/2023 which documented large volume of stool in her colon. Patient states she has "chronic sinus".  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      Home Medications    Prior to Admission medications   Medication Sig Start Date End Date Taking? Authorizing Provider  amLODipine (NORVASC) 5 MG tablet Take 5 mg by mouth daily.   Yes [provider]  cefdinir (OMNICEF) 300 MG capsule Take 1 capsule (300 mg total) by mouth 2 (two) times daily. 11/23/23  Yes Eustace Moore, MD  traMADol (ULTRAM) 50 MG tablet Take 1 tablet (50 mg total) by mouth every 6 (six) hours as needed. 11/23/23  Yes Eustace Moore, MD  ALPRAZolam Prudy Feeler) 0.5 MG tablet Take 1 tablet (0.5 mg total) by mouth daily as needed for anxiety. 07/28/23   Thresa Ross, MD  atenolol (TENORMIN) 25 MG tablet Take by mouth. 02/04/16 04/14/17  [provider]  buPROPion (WELLBUTRIN) 100 MG tablet TAKE 1 TABLET BY MOUTH DAILY 06/29/23   Thresa Ross, MD  gabapentin (NEURONTIN) 100 MG capsule TAKE 2 at night 05/30/23  Thresa Ross, MD  omeprazole (PRILOSEC) 20 MG capsule TK 1 C PO QD 02/01/17   [provider]  SYMBICORT 160-4.5 MCG/ACT inhaler INL 2 PFS PO BID 01/30/18   [provider]  amitriptyline (ELAVIL) 10 MG tablet Take 1 tablet (10 mg total) by mouth at bedtime. 02/01/17 02/22/17  Thresa Ross, MD  escitalopram (LEXAPRO) 5 MG tablet Take 1 tablet (5 mg total) by mouth daily. Patient not taking: Reported on 05/19/2016 05/06/16 05/20/16  Thresa Ross, MD  mirtazapine (REMERON) 7.5 MG tablet Take 1 tablet (7.5 mg total) by mouth at bedtime. 01/06/17 02/01/17  Thresa Ross, MD  traZODone (DESYREL) 50 MG tablet TK 1 T PO TID 01/11/18 07/25/18  [provider]  venlafaxine XR (EFFEXOR-XR) 37.5 MG 24 hr capsule Take 1 capsule (37.5 mg total) by mouth daily with breakfast. 11/11/16 01/06/17  Thresa Ross, MD    Family History Family History  Problem Relation Age of Onset   Cancer Mother    Diabetes Father    Anxiety disorder Father    Anxiety disorder Sister    Anxiety disorder Brother     Social History Social History   Tobacco Use   Smoking status: Every Day    Current packs/day: 1.00    Average packs/day: 1 pack/day for 15.0 years (15.0 ttl pk-yrs)    Types: Cigarettes   Smokeless tobacco: Never   Tobacco comments:    patch, e-cigarettes  Vaping Use   Vaping status: Never Used  Substance Use Topics   Alcohol use: No    Alcohol/week: 1.0 standard drink of alcohol    Types: 1 Glasses of wine per week   Drug use: No     Allergies   Moxifloxacin, Sulfa antibiotics, and Amoxicillin-pot clavulanate   Review of Systems Review of Systems See HPI  Physical Exam Triage Vital Signs ED Triage Vitals  Encounter Vitals Group     BP 11/23/23 0938 105/73     Systolic BP Percentile --      Diastolic BP Percentile --      Pulse Rate 11/23/23 0938 79     Resp 11/23/23 0938 16     Temp 11/23/23 0938 97.8 F (36.6 C)     Temp src --      SpO2 11/23/23 0938 99 %     Weight --      Height --      Head Circumference --      Peak Flow --      Pain Score 11/23/23 0947 3     Pain Loc --      Pain Education --      Exclude from Growth Chart --    No data found.  Updated Vital Signs BP 105/73   Pulse 79   Temp 97.8 F (36.6 C)   Resp 16   SpO2 99%       Physical Exam Constitutional:      General: She is not in acute distress.    Appearance: She is well-developed.  HENT:     Head: Normocephalic and atraumatic.     Right Ear: Tympanic membrane and ear canal normal.     Ears:     Comments: Left TM is injected and dull    Mouth/Throat:     Mouth: Mucous membranes are moist.  Eyes:      Conjunctiva/sclera: Conjunctivae normal.     Pupils: Pupils are equal, round, and reactive to light.  Cardiovascular:     Rate  and Rhythm: Normal rate and regular rhythm.  Pulmonary:     Effort: Pulmonary effort is normal. No respiratory distress.  Abdominal:     General: Abdomen is flat. Bowel sounds are increased. There is no distension.     Palpations: Abdomen is soft. There is no hepatomegaly or splenomegaly.     Tenderness: There is abdominal tenderness in the epigastric area and left upper quadrant.    Musculoskeletal:        General: Normal range of motion.     Cervical back: Normal range of motion.  Skin:    General: Skin is warm and dry.  Neurological:     Mental Status: She is alert.      UC Treatments / Results  Labs (all labs ordered are listed, but only abnormal results are displayed) Labs Reviewed - No data to display  EKG   Radiology No results found.  Procedures Procedures (including critical care time)  Medications Ordered in UC Medications - No data to display  Initial Impression / Assessment and Plan / UC Course  I have reviewed the triage vital signs and the nursing notes.  Pertinent labs & imaging results that were available during my care of the patient were reviewed by me and considered in my medical decision making (see chart for details).     I feel patient likely has crampy left upper and left mid abdominal pain from her constipation, pain at splenic flexure.  She also has an ear infection.  Will treat her with antibiotics, recommend fiber supplementation and increased water.  Follow-up with GI Final Clinical Impressions(s) / UC Diagnoses   Final diagnoses:  Left upper quadrant abdominal pain  Chronic constipation with overflow  Recurrent acute suppurative otitis media of right ear without spontaneous rupture of tympanic membrane     Discharge Instructions      I am prescribing cefdinir to take for your ear infection I am hoping  this will help with your colon inflammation Your x-ray showed constipation.  As I reviewed your chart it looks like this is a chronic problem.  You need to take a daily fiber supplement to regulate your bowels You may need to see your gastroenterologist if this pain persists May take tramadol as needed for pain    ED Prescriptions     Medication Sig Dispense Auth. Provider   cefdinir (OMNICEF) 300 MG capsule Take 1 capsule (300 mg total) by mouth 2 (two) times daily. 14 capsule Eustace Moore, MD   traMADol (ULTRAM) 50 MG tablet Take 1 tablet (50 mg total) by mouth every 6 (six) hours as needed. 15 tablet Eustace Moore, MD      I have reviewed the PDMP during this encounter.   Eustace Moore, MD 11/23/23 1150

## 2023-11-28 ENCOUNTER — Telehealth (HOSPITAL_COMMUNITY): Payer: Self-pay | Admitting: *Deleted

## 2023-11-28 ENCOUNTER — Telehealth: Payer: Self-pay

## 2023-11-28 DIAGNOSIS — R945 Abnormal results of liver function studies: Secondary | ICD-10-CM | POA: Diagnosis not present

## 2023-11-28 DIAGNOSIS — K760 Fatty (change of) liver, not elsewhere classified: Secondary | ICD-10-CM | POA: Diagnosis not present

## 2023-11-28 MED ORDER — ALPRAZOLAM 0.5 MG PO TABS: 0.5000 mg | ORAL_TABLET | Freq: Every day | ORAL | 0 refills | Status: DC | PRN

## 2023-11-28 NOTE — Telephone Encounter (Signed)
Copied from CRM [id number]. Topic: Appointments - Scheduling Inquiry for Clinic >> Nov 28, 2023  1:05 PM Gaetano Hawthorne wrote: Reason for CRM: Patient made an appointment with Dr. Tamera Punt as a new patient. She was speaking with her son Martha Roberts - 02/29/1980) who sees Christen Butter and is really happy with the care she provides. He mentioned that she could see her as well since she's a relative - please call patient to schedule.  Contact center has [employer] flagged as not taking any new patients.

## 2023-11-28 NOTE — Telephone Encounter (Signed)
Martha Roberts would like you to be his mom's PCP. Is it ok to schedule patient as a new patient?

## 2023-11-28 NOTE — Telephone Encounter (Signed)
[facility] -- called  & stated patients refill for the following is under investigation DUE  to it has been in transit since 11/18/23  ALPRAZolam (XANAX) 0.5 MG tablet  [id number] - [place], [place] - [address]    Spoke with patient & she would like new script sent to local Rx

## 2023-11-28 NOTE — Addendum Note (Signed)
Addended by: Thresa Ross on: 11/28/2023 07:31 PM   Modules accepted: Orders

## 2023-11-29 NOTE — Telephone Encounter (Signed)
Patient seen in office today. 

## 2023-11-30 DIAGNOSIS — R112 Nausea with vomiting, unspecified: Secondary | ICD-10-CM | POA: Diagnosis not present

## 2023-11-30 DIAGNOSIS — R1012 Left upper quadrant pain: Secondary | ICD-10-CM | POA: Diagnosis not present

## 2023-11-30 DIAGNOSIS — R748 Abnormal levels of other serum enzymes: Secondary | ICD-10-CM | POA: Diagnosis not present

## 2023-12-06 DIAGNOSIS — Z9049 Acquired absence of other specified parts of digestive tract: Secondary | ICD-10-CM | POA: Diagnosis not present

## 2023-12-06 DIAGNOSIS — R1012 Left upper quadrant pain: Secondary | ICD-10-CM | POA: Diagnosis not present

## 2023-12-06 DIAGNOSIS — I700 Atherosclerosis of aorta: Secondary | ICD-10-CM | POA: Diagnosis not present

## 2023-12-06 DIAGNOSIS — R112 Nausea with vomiting, unspecified: Secondary | ICD-10-CM | POA: Diagnosis not present

## 2023-12-19 ENCOUNTER — Encounter: Payer: Self-pay | Admitting: Medical-Surgical

## 2023-12-23 ENCOUNTER — Ambulatory Visit: Payer: Self-pay | Admitting: Family Medicine

## 2023-12-27 ENCOUNTER — Encounter: Payer: Self-pay | Admitting: Medical-Surgical

## 2023-12-27 ENCOUNTER — Ambulatory Visit (INDEPENDENT_AMBULATORY_CARE_PROVIDER_SITE_OTHER): Payer: 59 | Admitting: Medical-Surgical

## 2023-12-27 VITALS — BP 108/73 | HR 82 | Resp 20 | Ht 59.0 in | Wt 128.7 lb

## 2023-12-27 DIAGNOSIS — R197 Diarrhea, unspecified: Secondary | ICD-10-CM

## 2023-12-27 DIAGNOSIS — R1012 Left upper quadrant pain: Secondary | ICD-10-CM | POA: Diagnosis not present

## 2023-12-27 DIAGNOSIS — Z7689 Persons encountering health services in other specified circumstances: Secondary | ICD-10-CM

## 2023-12-27 NOTE — Progress Notes (Signed)
 New Patient Office Visit  Subjective:  Patient ID: Martha Roberts, female    DOB: December 23, 1960  Age: 63 y.o. MRN: [id number]  CC:  Chief Complaint  Patient presents with   Establish Care   HPI Martha Roberts presents to establish care.  She is a pleasant 63 year old female who was previously seen [facility].  She is under the care of multiple specialist including psychiatry, behavioral health, pulmonology, gastroenterology, and cardiology.  Today, she reports that her biggest concern is related to abdominal pain.  She has epigastric abdominal pain that leads into the left upper quadrant.  Notes that her pain is constant along her ribs however the left upper quadrant abdominal pain comes and goes.  Over the last couple of days, she has had some intermittent right upper quadrant stabbing pains.  She was evaluated by gastro who did a CT of the abdomen and pelvis and prescribed her dicyclomine.  Notes that the dicyclomine is helpful and tends to ease the pain however does not take it away completely.  She is also using gabapentin  which has not helped.  She was given a small quantity of tramadol  but did not try this as she figured it would not help.  Endorses chronic issues with diarrhea for many years (since her cholecystectomy) however the quality of her stools have changed over the last few months.  She notes that her stools are greasy and tend to float at the top of the water.  Also endorses a foul odor that was not present before.  Reports that she has frequent sinus infections and is on antibiotics regularly.  Her last round of antibiotics was approximately 2 months ago.  Has not seen any blood or black tarry stools.  Has some nausea but no vomiting.  Has not had any stool studies completed.  Past Medical History:  Diagnosis Date   Anxiety    Asthma    Depression    Fibromyalgia    GERD (gastroesophageal reflux disease)    Hypertension    PVC (premature ventricular  contraction)     Past Surgical History:  Procedure Laterality Date   BREAST ENHANCEMENT SURGERY     BREAST SURGERY     CARDIAC CATHETERIZATION     CESAREAN SECTION     CHOLECYSTECTOMY     COSMETIC SURGERY     EYE SURGERY     SINUS EXPLORATION     TUBAL LIGATION      Family History  Problem Relation Age of Onset   Cancer Mother    Depression Mother    Early death Mother    Hypertension Mother    Diabetes Father    Anxiety disorder Father    Alcohol abuse Father    Anxiety disorder Sister    Anxiety disorder Brother    Alcohol abuse Brother    Cancer Brother    ADD / ADHD Daughter    Anxiety disorder Daughter    Asthma Daughter    Hypertension Daughter    Learning disabilities Daughter    ADD / ADHD Son    Asthma Son     Social History   Socioeconomic History   Marital status: Divorced    Spouse name: Not on file   Number of children: Not on file   Years of education: Not on file   Highest education level: Not on file  Occupational History   Not on file  Tobacco Use   Smoking status: Every Day  Current packs/day: 1.50    Average packs/day: 1.4 packs/day for 50.0 years (67.5 ttl pk-yrs)    Types: Cigarettes   Smokeless tobacco: Never   Tobacco comments:    patch, e-cigarettes  Vaping Use   Vaping status: Never Used  Substance and Sexual Activity   Alcohol use: No    Alcohol/week: 1.0 standard drink of alcohol    Types: 1 Glasses of wine per week   Drug use: No   Sexual activity: Not Currently    Birth control/protection: Abstinence  Other Topics Concern   Not on file  Social History Narrative   ** Merged History Encounter **       ** Merged History Encounter **       Social Drivers of Corporate Investment Banker Strain: Low Risk  (03/23/2023)   Received from [employer], [facility]   Overall Financial Resource Strain (CARDIA)    Difficulty of Paying Living Expenses: Not very hard  Food Insecurity: Food Insecurity Present (03/23/2023)    Received from [facility], [facility]   Hunger Vital Sign    Worried About Running Out of Food in the Last Year: Sometimes true    Ran Out of Food in the Last Year: Sometimes true  Transportation Needs: Unmet Transportation Needs (03/23/2023)   Received from [employer], [facility]   PRAPARE - Transportation    Lack of Transportation (Medical): Yes    Lack of Transportation (Non-Medical): Yes  Physical Activity: Unknown (03/23/2023)   Received from [facility], [facility]   Exercise Vital Sign    Days of Exercise per Week: Patient declined    Minutes of Exercise per Session: Not on file  Stress: Stress Concern Present (03/23/2023)   Received from [facility], [facility]   [name] - Occupational Stress Questionnaire    Feeling of Stress : To some extent  Social Connections: Socially Isolated (03/23/2023)   Received from [facility], [facility]   Social Network    How would you rate your social network (family, work, friends)?: Little participation, lonely and socially isolated  Intimate Partner Violence: Not At Risk (03/23/2023)   Received from [facility], [facility]   HITS    Over the last 12 months how often did your partner physically hurt you?: Never    Over the last 12 months how often did your partner insult you or talk down to you?: Never    Over the last 12 months how often did your partner threaten you with physical harm?: Never    Over the last 12 months how often did your partner scream or curse at you?: Never    ROS Review of Systems  Constitutional:  Positive for activity change, appetite change and fatigue. Negative for chills and fever.  Respiratory:  Negative for cough, chest tightness, shortness of breath and wheezing.   Cardiovascular:  Negative for chest pain and palpitations.  Gastrointestinal:  Positive for abdominal pain, diarrhea and nausea. Negative for blood in stool, constipation and vomiting.   Musculoskeletal:  Positive for arthralgias and myalgias.  Psychiatric/Behavioral:  Positive for dysphoric mood. Negative for self-injury and suicidal ideas. The patient is nervous/anxious.     Objective:   Today's Vitals: BP 108/73 (BP Location: Right Arm, Cuff Size: Normal)   Pulse 82   Resp 20   Ht 4' 11 (1.499 m)   Wt 128 lb 11.2 oz (58.4 kg)   SpO2 98%   BMI 25.99 kg/m  Physical Exam Vitals reviewed.  Constitutional:      General: She is not in acute distress.    Appearance: Normal appearance. She is not ill-appearing.  HENT:     Head: Normocephalic and atraumatic.  Eyes:     General: No scleral icterus.       Right eye: No discharge.        Left eye: No discharge.     Extraocular Movements: Extraocular movements intact.     Conjunctiva/sclera: Conjunctivae normal.     Pupils: Pupils are equal, round, and reactive to light.  Cardiovascular:     Rate and Rhythm: Normal rate and regular rhythm.     Pulses: Normal pulses.     Heart sounds: Normal heart sounds. No murmur heard.    No friction rub. No gallop.  Pulmonary:     Effort: Pulmonary effort is normal. No respiratory distress.     Breath sounds: Normal breath sounds. No wheezing.  Abdominal:     General: There is no distension.     Palpations: Abdomen is soft. There is no mass.     Tenderness: There is abdominal tenderness (Generalized, worse in the left upper quadrant and left lower quadrant). There is guarding. There is no right CVA tenderness, left CVA tenderness or rebound.     Hernia: No hernia is present.  Musculoskeletal:     Cervical back: Neck supple.  Lymphadenopathy:     Cervical: No cervical adenopathy.  Skin:    General: Skin is warm and dry.  Neurological:     Mental Status: She is alert and oriented to person, place, and time.     Gait: Gait abnormal (Secondary to pain and musculoskeletal conditions).  Psychiatric:        Mood and Affect: Mood normal.        Behavior: Behavior normal.         Thought Content: Thought content normal.        Judgment: Judgment normal.    Assessment & Plan:   1. Encounter to establish care (Primary) Reviewed available information and discussed care concerns with patient.  Chart updated with most recent information.  Records request sent to her prior PCP for continuity of care.  2. Diarrhea, unspecified type 3. Left upper quadrant abdominal pain Unclear etiology.  She has had some blood work per her report about 2-3 weeks ago but we do not have any record of this.  Can see the CT report in care everywhere but unable to see the images for independent evaluation.  Requesting records for lab evaluation.  Declined doing further labs today.  Since no stool studies have been done, plan for stool evaluations as below.  Concern for possible C. difficile given antibiotic use.  Reviewed recommendations for a probiotic which may help with symptoms.  Okay to use dicyclomine and gabapentin .  Avoid antidiarrheals.  Although up with gastroenterology as instructed. - Ova and parasite examination - Fecal lactoferrin, quant - Clostridium difficile EIA - Sodium, stool - Osmolality, stool - Fecal fat, quantitative - Stool culture - POC Hemoccult Bld/Stl (1-Cd Office Dx) - Giardia lamblia antibody, IFA  Outpatient Encounter Medications as of 12/27/2023  Medication Sig   ALPRAZolam  (XANAX ) 0.5 MG tablet Take 1 tablet (0.5 mg total) by mouth daily as needed for anxiety.   amLODipine (NORVASC) 5 MG tablet Take 5 mg by mouth daily.   buPROPion  (WELLBUTRIN ) 100 MG tablet TAKE 1 TABLET BY MOUTH DAILY   dicyclomine (BENTYL) 20 MG tablet Take  20 mg by mouth 3 (three) times daily before meals.   gabapentin  (NEURONTIN ) 100 MG capsule TAKE 2 at night   omeprazole (PRILOSEC) 20 MG capsule TK 1 C PO QD   SYMBICORT 160-4.5 MCG/ACT inhaler INL 2 PFS PO BID   traMADol  (ULTRAM ) 50 MG tablet Take 1 tablet (50 mg total) by mouth every 6 (six) hours as needed.   atenolol   (TENORMIN ) 25 MG tablet Take by mouth.   [DISCONTINUED] amitriptyline  (ELAVIL ) 10 MG tablet Take 1 tablet (10 mg total) by mouth at bedtime.   [DISCONTINUED] cefdinir  (OMNICEF ) 300 MG capsule Take 1 capsule (300 mg total) by mouth 2 (two) times daily.   [DISCONTINUED] escitalopram  (LEXAPRO ) 5 MG tablet Take 1 tablet (5 mg total) by mouth daily. (Patient not taking: Reported on 05/19/2016)   [DISCONTINUED] mirtazapine  (REMERON ) 7.5 MG tablet Take 1 tablet (7.5 mg total) by mouth at bedtime.   [DISCONTINUED] traZODone (DESYREL) 50 MG tablet TK 1 T PO TID   [DISCONTINUED] venlafaxine  XR (EFFEXOR -XR) 37.5 MG 24 hr capsule Take 1 capsule (37.5 mg total) by mouth daily with breakfast.   No facility-administered encounter medications on file as of 12/27/2023.    Follow-up: Return if symptoms worsen or fail to improve.   Martha FREDRIK Palin, DNP, APRN, FNP-BC [facility]

## 2023-12-28 ENCOUNTER — Encounter: Payer: Self-pay | Admitting: Medical-Surgical

## 2023-12-30 ENCOUNTER — Encounter (HOSPITAL_COMMUNITY): Payer: Self-pay | Admitting: Psychiatry

## 2023-12-30 ENCOUNTER — Telehealth (INDEPENDENT_AMBULATORY_CARE_PROVIDER_SITE_OTHER): Payer: 59 | Admitting: Psychiatry

## 2023-12-30 DIAGNOSIS — M797 Fibromyalgia: Secondary | ICD-10-CM | POA: Diagnosis not present

## 2023-12-30 DIAGNOSIS — F4310 Post-traumatic stress disorder, unspecified: Secondary | ICD-10-CM | POA: Diagnosis not present

## 2023-12-30 DIAGNOSIS — F341 Dysthymic disorder: Secondary | ICD-10-CM

## 2023-12-30 DIAGNOSIS — F411 Generalized anxiety disorder: Secondary | ICD-10-CM | POA: Diagnosis not present

## 2023-12-30 MED ORDER — ALPRAZOLAM 0.5 MG PO TABS: 0.5000 mg | ORAL_TABLET | Freq: Every day | ORAL | 2 refills | Status: DC | PRN

## 2023-12-30 NOTE — Progress Notes (Signed)
Patient ID: WILHELMENIA SORBO, female   DOB: 1961-08-12, 63 y.o.   MRN: [id number]  [facility] Outpatient Follow up visit   Patient Identification: Martha Roberts MRN:  [id number] Date of Evaluation:  12/30/2023 Referral Source: Dr. Tresa Endo , Primary care Chief Complaint:    depression follow up  Visit Diagnosis:    ICD-10-CM   1. GAD (generalized anxiety disorder)  F41.1     2. PTSD (post-traumatic stress disorder)  F43.10     3. Fibromyalgia  M79.7     4. Dysthymia  F34.1      Virtual Visit via Video Note  I connected with Martha Roberts on 12/30/23 at 10:30 AM EST by a video enabled telemedicine application and verified that I am speaking with the correct person using two identifiers.  Location: Patient: home Provider: home office   I discussed the limitations of evaluation and management by telemedicine and the availability of in person appointments. The patient expressed understanding and agreed to proceed.      I discussed the assessment and treatment plan with the patient. The patient was provided an opportunity to ask questions and all were answered. The patient agreed with the plan and demonstrated an understanding of the instructions.   The patient was advised to call back or seek an in-person evaluation if the symptoms worsen or if the condition fails to improve as anticipated.  I provided 20 minutes of non-face-to-face time during this encounter.      History of Present Illness:    Lives alone, going thru some stomach pain evaluation and stool test, its distressing other then that has a supportive son and tries to keep busy On xanax for anxiety and wellbutrin for depression Gaba at night and for fibro    Modifying factor : gardeniing when can Severity :  related to medical co morbidity    previous Psychotropic Medications: Yes    Family Psychiatric History: sister, father. Has anxiety and panic attacks  Family History:  Family History  Problem Relation  Age of Onset   Cancer Mother    Depression Mother    Early death Mother    Hypertension Mother    Diabetes Father    Anxiety disorder Father    Alcohol abuse Father    Anxiety disorder Sister    Anxiety disorder Brother    Alcohol abuse Brother    Cancer Brother    ADD / ADHD Daughter    Anxiety disorder Daughter    Asthma Daughter    Hypertension Daughter    Learning disabilities Daughter    ADD / ADHD Son    Asthma Son     Social History:   Social History   Socioeconomic History   Marital status: Divorced    Spouse name: Not on file   Number of children: Not on file   Years of education: Not on file   Highest education level: Not on file  Occupational History   Not on file  Tobacco Use   Smoking status: Every Day    Current packs/day: 1.50    Average packs/day: 1.4 packs/day for 50.0 years (67.5 ttl pk-yrs)    Types: Cigarettes   Smokeless tobacco: Never   Tobacco comments:    patch, e-cigarettes  Vaping Use   Vaping status: Never Used  Substance and Sexual Activity   Alcohol use: No    Alcohol/week: 1.0 standard drink of alcohol    Types: 1 Glasses of wine per week   Drug use:  No   Sexual activity: Not Currently    Birth control/protection: Abstinence  Other Topics Concern   Not on file  Social History Narrative   ** Merged History Encounter **       ** Merged History Encounter **       Social Drivers of Corporate investment banker Strain: Low Risk  (03/23/2023)   Received from [employer], [facility]   Overall Financial Resource Strain (CARDIA)    Difficulty of Paying Living Expenses: Not very hard  Food Insecurity: Food Insecurity Present (03/23/2023)   Received from [facility], [facility]   Hunger Vital Sign    Worried About Running Out of Food in the Last Year: Sometimes true    Ran Out of Food in the Last Year: Sometimes true  Transportation Needs: Unmet Transportation Needs (03/23/2023)   Received from [facility], [facility]    PRAPARE - Transportation    Lack of Transportation (Medical): Yes    Lack of Transportation (Non-Medical): Yes  Physical Activity: Unknown (03/23/2023)   Received from [facility], [facility]   Exercise Vital Sign    Days of Exercise per Week: Patient declined    Minutes of Exercise per Session: Not on file  Stress: Stress Concern Present (03/23/2023)   Received from [facility], [facility] - Occupational Stress Questionnaire    Feeling of Stress : To some extent  Social Connections: Socially Isolated (03/23/2023)   Received from [facility], [facility]   Social Network    How would you rate your social network (family, work, friends)?: Little participation, lonely and socially isolated    Allergies:   Allergies  Allergen Reactions   Moxifloxacin Hives   Sulfa Antibiotics Hives   Trimethoprim Hives   Amoxicillin-Pot Clavulanate Nausea And Vomiting   Sulfamethoxazole-Trimethoprim Itching    Metabolic Disorder Labs: No results found for: "HGBA1C", "MPG" No results found for: "PROLACTIN" No results found for: "CHOL", "TRIG", "HDL", "CHOLHDL", "VLDL", "LDLCALC"   Current Medications: Current Outpatient Medications  Medication Sig Dispense Refill   ALPRAZolam (XANAX) 0.5 MG tablet Take 1 tablet (0.5 mg total) by mouth daily as needed for anxiety. 30 tablet 2   amLODipine (NORVASC) 5 MG tablet Take 5 mg by mouth daily.     atenolol (TENORMIN) 25 MG tablet Take by mouth.     buPROPion (WELLBUTRIN) 100 MG tablet TAKE 1 TABLET BY MOUTH DAILY 100 tablet 2   dicyclomine (BENTYL) 20 MG tablet Take 20 mg by mouth 3 (three) times daily before meals.     gabapentin (NEURONTIN) 100 MG capsule TAKE 2 at night 120 capsule 0   omeprazole (PRILOSEC) 20 MG capsule TK 1 C PO QD  11   SYMBICORT 160-4.5 MCG/ACT inhaler INL 2 PFS PO BID  6   traMADol (ULTRAM) 50 MG tablet Take 1 tablet (50 mg total) by mouth every 6 (six) hours as needed.  15 tablet 0   No current facility-administered medications for this visit.      Psychiatric Specialty Exam: Review of Systems  Musculoskeletal:  Positive for myalgias.  Neurological:  Negative for tremors.  Psychiatric/Behavioral:  Negative for substance abuse and suicidal ideas.     There were no vitals taken for this visit.There is no height or weight on file to calculate BMI.  General Appearance: casual  Eye Contact:  fair  Speech:  Normal Rate  Volume:  Decreased  Mood:fair  Affect: congruent  Thought Process:  Coherent  Orientation:  Full (Time, Place, and Person)  Thought Content: clear  Suicidal Thoughts:  No  Homicidal Thoughts:  No  Memory:  Immediate;   Fair Recent;   Fair  Judgement:  Fair  Insight:  fair  Psychomotor Activity:  Normal  Concentration:  Concentration: Fair and Attention Span: Fair  Recall:  [employer] of Knowledge:Fair  Language: Fair  Akathisia:  Negative  Handed:  Right  AIMS (if indicated):    Assets:  Communication Skills Desire for Improvement Social Support  ADL's:  Intact  Cognition: WNL  Sleep:  fair    Treatment Plan Summary: Plan as folows  Prior documentation reviewed   1. Depression: upset due to medical illness otherwise feel med are ok for depression, continue wellbutrin 2. ZOX:WRUEAV due to above, consider therapy, takex xanax prn or daily, continue coping skills  3. Ptsd: baseline, not dwelling on past, conitnue coping skills and xanax prn   Fu 6m. Follow up with providers for medical co morbidities   Collaboration of Care: Other patient has follow up appointment with deramtologist to assess her hand condition or dermatitis  Patient/Guardian was advised Release of Information must be obtained prior to any record release in order to collaborate their care with an outside provider. Patient/Guardian was advised if they have not already done so to contact the registration department to sign all necessary forms in  order for [place] to release information regarding their care.   Consent: Patient/Guardian gives verbal consent for treatment and assignment of benefits for services provided during this visit. Patient/Guardian expressed understanding and agreed to proceed.    Fu 2-39m.  Thresa Ross, MD 1/17/202510:34 AM

## 2023-12-31 DIAGNOSIS — R197 Diarrhea, unspecified: Secondary | ICD-10-CM | POA: Diagnosis not present

## 2024-01-01 ENCOUNTER — Encounter: Payer: Self-pay | Admitting: Medical-Surgical

## 2024-01-01 DIAGNOSIS — R197 Diarrhea, unspecified: Secondary | ICD-10-CM | POA: Diagnosis not present

## 2024-01-02 DIAGNOSIS — R197 Diarrhea, unspecified: Secondary | ICD-10-CM | POA: Diagnosis not present

## 2024-01-05 ENCOUNTER — Encounter: Payer: Self-pay | Admitting: Medical-Surgical

## 2024-01-05 ENCOUNTER — Encounter (HOSPITAL_COMMUNITY): Payer: Self-pay

## 2024-01-05 LAB — SPECIMEN STATUS REPORT

## 2024-01-05 LAB — OVA AND PARASITE EXAMINATION

## 2024-01-05 LAB — CLOSTRIDIUM DIFFICILE EIA: C difficile Toxins A+B, EIA: NEGATIVE

## 2024-01-07 ENCOUNTER — Encounter: Payer: Self-pay | Admitting: Medical-Surgical

## 2024-01-07 LAB — SODIUM, STOOL

## 2024-01-07 LAB — FECAL OCCULT BLOOD, IMMUNOCHEMICAL

## 2024-01-07 LAB — FECAL FAT, QUANTITATIVE

## 2024-01-07 LAB — OSMOLALITY, STOOL

## 2024-01-07 LAB — SPECIMEN STATUS REPORT

## 2024-01-07 LAB — FECAL FAT, QUALITATIVE
Fat Qual Neutral, Stl: NORMAL
Fat Qual Total, Stl: NORMAL

## 2024-01-09 ENCOUNTER — Encounter (HOSPITAL_COMMUNITY): Payer: Self-pay | Admitting: Psychiatry

## 2024-01-09 ENCOUNTER — Telehealth (HOSPITAL_COMMUNITY): Payer: Self-pay | Admitting: Psychiatry

## 2024-01-09 NOTE — Telephone Encounter (Signed)
Patient sent MyChart message stating she had received another summons for jury duty. Requests letter from provider excusing her from serving.

## 2024-01-10 ENCOUNTER — Encounter: Payer: Self-pay | Admitting: Medical-Surgical

## 2024-01-10 DIAGNOSIS — Z8639 Personal history of other endocrine, nutritional and metabolic disease: Secondary | ICD-10-CM

## 2024-01-10 LAB — SPECIMEN STATUS REPORT

## 2024-01-10 LAB — O+P EXAM, FORMALIN ONLY

## 2024-01-11 LAB — STOOL CULTURE: E coli, Shiga toxin Assay: NEGATIVE

## 2024-01-11 LAB — FECAL LACTOFERRIN, QUANT

## 2024-01-11 LAB — GIARDIA LAMBLIA ANTIBODY, IFA: Giardia Ag, Stl: NEGATIVE

## 2024-01-16 ENCOUNTER — Encounter: Payer: Self-pay | Admitting: Medical-Surgical

## 2024-01-16 MED ORDER — CHOLESTYRAMINE 4 G PO PACK: 4.0000 g | PACK | Freq: Two times a day (BID) | ORAL | 12 refills | Status: DC

## 2024-01-16 MED ORDER — ROSUVASTATIN CALCIUM 20 MG PO TABS: 20.0000 mg | ORAL_TABLET | Freq: Every day | ORAL | 3 refills | Status: DC

## 2024-01-16 NOTE — Telephone Encounter (Signed)
Did not see any rosuvastatin in patient chart in current or past medication history

## 2024-01-16 NOTE — Addendum Note (Signed)
Addended byChristen Butter on: 01/16/2024 08:00 PM   Modules accepted: Orders

## 2024-01-20 ENCOUNTER — Encounter: Payer: Self-pay | Admitting: Medical-Surgical

## 2024-01-20 DIAGNOSIS — Z20828 Contact with and (suspected) exposure to other viral communicable diseases: Secondary | ICD-10-CM

## 2024-01-20 MED ORDER — OSELTAMIVIR PHOSPHATE 75 MG PO CAPS: 75.0000 mg | ORAL_CAPSULE | Freq: Two times a day (BID) | ORAL | 0 refills | Status: DC

## 2024-02-23 ENCOUNTER — Telehealth

## 2024-02-23 ENCOUNTER — Telehealth: Admitting: Physician Assistant

## 2024-02-23 DIAGNOSIS — B9689 Other specified bacterial agents as the cause of diseases classified elsewhere: Secondary | ICD-10-CM | POA: Diagnosis not present

## 2024-02-23 DIAGNOSIS — J019 Acute sinusitis, unspecified: Secondary | ICD-10-CM | POA: Diagnosis not present

## 2024-02-23 MED ORDER — DOXYCYCLINE HYCLATE 100 MG PO TABS: 100.0000 mg | ORAL_TABLET | Freq: Two times a day (BID) | ORAL | 0 refills | Status: DC

## 2024-02-23 MED ORDER — FLUTICASONE PROPIONATE 50 MCG/ACT NA SUSP: 2.0000 | Freq: Every day | NASAL | 0 refills | Status: AC

## 2024-02-23 NOTE — Progress Notes (Signed)
 Virtual Visit Consent   Martha Roberts, you are scheduled for a virtual visit with a  provider today. Just as with appointments in the office, your consent must be obtained to participate. Your consent will be active for this visit and any virtual visit you may have with one of our providers in the next 365 days. If you have a MyChart account, a copy of this consent can be sent to you electronically.  As this is a virtual visit, video technology does not allow for your provider to perform a traditional examination. This may limit your provider's ability to fully assess your condition. If your provider identifies any concerns that need to be evaluated in person or the need to arrange testing (such as labs, EKG, etc.), we will make arrangements to do so. Although advances in technology are sophisticated, we cannot ensure that it will always work on either your end or our end. If the connection with a video visit is poor, the visit may have to be switched to a telephone visit. With either a video or telephone visit, we are not always able to ensure that we have a secure connection.  By engaging in this virtual visit, you consent to the provision of healthcare and authorize for your insurance to be billed (if applicable) for the services provided during this visit. Depending on your insurance coverage, you may receive a charge related to this service.  I need to obtain your verbal consent now. Are you willing to proceed with your visit today? Martha Roberts has provided verbal consent on 02/23/2024 for a virtual visit (video or telephone). Piedad Climes, [place]  Date: 02/23/2024 5:24 PM   Virtual Visit via Video Note   I, Piedad Climes, connected with  Martha Roberts  ([id number], [birth year]) on 02/23/24 at  6:15 PM EDT by a video-enabled telemedicine application and verified that I am speaking with the correct person using two identifiers.  Location: Patient: Virtual Visit  Location Patient: Home Provider: Virtual Visit Location Provider: Home Office   I discussed the limitations of evaluation and management by telemedicine and the availability of in person appointments. The patient expressed understanding and agreed to proceed.    History of Present Illness: Martha Roberts is a 63 y.o. who identifies as a female who was assigned female at birth, and is being seen today for possible sinusitis after recent influenza. Notes left sided nasal congestion, sinus pressure, sinus pain and facial pain. Notes thick yellow nasal discharge. Denies fever, chills.  Some cough, worse in the mornings.   HPI: HPI  Problems:  Patient Active Problem List   Diagnosis Date Noted  . Agoraphobia with panic disorder 05/19/2016  . GAD (generalized anxiety disorder) 05/19/2016  . PTSD (post-traumatic stress disorder) 05/19/2016  . Combined fat and carbohydrate induced hyperlipemia 06/27/2015    Allergies:  Allergies  Allergen Reactions  . Moxifloxacin Hives  . Sulfa Antibiotics Hives  . Trimethoprim Hives  . Amoxicillin-Pot Clavulanate Nausea And Vomiting  . Sulfamethoxazole-Trimethoprim Itching   Medications:  Current Outpatient Medications:  .  doxycycline (VIBRA-TABS) 100 MG tablet, Take 1 tablet (100 mg total) by mouth 2 (two) times daily., Disp: 14 tablet, Rfl: 0 .  fluticasone (FLONASE) 50 MCG/ACT nasal spray, Place 2 sprays into both nostrils daily., Disp: 16 g, Rfl: 0 .  ALPRAZolam (XANAX) 0.5 MG tablet, Take 1 tablet (0.5 mg total) by mouth daily as needed for anxiety., Disp: 30 tablet, Rfl: 2 .  amLODipine (  NORVASC) 5 MG tablet, Take 5 mg by mouth daily., Disp: , Rfl:  .  atenolol (TENORMIN) 25 MG tablet, Take by mouth., Disp: , Rfl:  .  buPROPion (WELLBUTRIN) 100 MG tablet, TAKE 1 TABLET BY MOUTH DAILY, Disp: 100 tablet, Rfl: 2 .  cholestyramine (QUESTRAN) 4 g packet, Take 1 packet (4 g total) by mouth 2 (two) times daily with a meal., Disp: 60 each, Rfl: 12 .   gabapentin (NEURONTIN) 100 MG capsule, TAKE 2 at night, Disp: 120 capsule, Rfl: 0 .  omeprazole (PRILOSEC) 20 MG capsule, TK 1 C PO QD, Disp: , Rfl: 11 .  rosuvastatin (CRESTOR) 20 MG tablet, Take 1 tablet (20 mg total) by mouth daily., Disp: 90 tablet, Rfl: 3 .  SYMBICORT 160-4.5 MCG/ACT inhaler, INL 2 PFS PO BID, Disp: , Rfl: 6  Observations/Objective: Patient is well-developed, well-nourished in no acute distress.  Resting comfortably at home.  Head is normocephalic, atraumatic.  No labored breathing. Speech is clear and coherent with logical content.  Patient is alert and oriented at baseline.   Assessment and Plan: 1. Acute bacterial sinusitis (Primary) - fluticasone (FLONASE) 50 MCG/ACT nasal spray; Place 2 sprays into both nostrils daily.  Dispense: 16 g; Refill: 0 - doxycycline (VIBRA-TABS) 100 MG tablet; Take 1 tablet (100 mg total) by mouth 2 (two) times daily.  Dispense: 14 tablet; Refill: 0  Rx Doxycycline.  Increase fluids.  Rest.  Saline nasal spray.  Probiotic.  Mucinex as directed.  Humidifier in bedroom. Flonase per orders.  Call or return to clinic if symptoms are not improving.   Follow Up Instructions: I discussed the assessment and treatment plan with the patient. The patient was provided an opportunity to ask questions and all were answered. The patient agreed with the plan and demonstrated an understanding of the instructions.  A copy of instructions were sent to the patient via MyChart unless otherwise noted below.   The patient was advised to call back or seek an in-person evaluation if the symptoms worsen or if the condition fails to improve as anticipated.    Piedad Climes, PA-C

## 2024-02-23 NOTE — Patient Instructions (Signed)
 Martha Roberts, thank you for joining Martha Climes, PA-C for today's virtual visit.  While this provider is not your primary care provider (PCP), if your PCP is located in our provider database this encounter information will be shared with them immediately following your visit.   A [facility] MyChart account gives you access to today's visit and all your visits, tests, and labs performed at [facility] " click here if you don't have a [facility] MyChart account or go to [email or web address]  Consent: (Patient) Martha Roberts provided verbal consent for this virtual visit at the beginning of the encounter.  Current Medications:  Current Outpatient Medications:    ALPRAZolam (XANAX) 0.5 MG tablet, Take 1 tablet (0.5 mg total) by mouth daily as needed for anxiety., Disp: 30 tablet, Rfl: 2   amLODipine (NORVASC) 5 MG tablet, Take 5 mg by mouth daily., Disp: , Rfl:    atenolol (TENORMIN) 25 MG tablet, Take by mouth., Disp: , Rfl:    buPROPion (WELLBUTRIN) 100 MG tablet, TAKE 1 TABLET BY MOUTH DAILY, Disp: 100 tablet, Rfl: 2   cholestyramine (QUESTRAN) 4 g packet, Take 1 packet (4 g total) by mouth 2 (two) times daily with a meal., Disp: 60 each, Rfl: 12   dicyclomine (BENTYL) 20 MG tablet, Take 20 mg by mouth 3 (three) times daily before meals., Disp: , Rfl:    gabapentin (NEURONTIN) 100 MG capsule, TAKE 2 at night, Disp: 120 capsule, Rfl: 0   omeprazole (PRILOSEC) 20 MG capsule, TK 1 C PO QD, Disp: , Rfl: 11   oseltamivir (TAMIFLU) 75 MG capsule, Take 1 capsule (75 mg total) by mouth 2 (two) times daily., Disp: 10 capsule, Rfl: 0   rosuvastatin (CRESTOR) 20 MG tablet, Take 1 tablet (20 mg total) by mouth daily., Disp: 90 tablet, Rfl: 3   SYMBICORT 160-4.5 MCG/ACT inhaler, INL 2 PFS PO BID, Disp: , Rfl: 6   traMADol (ULTRAM) 50 MG tablet, Take 1 tablet (50 mg total) by mouth every 6 (six) hours as needed., Disp: 15 tablet, Rfl: 0   Medications ordered in this  encounter:  No orders of the defined types were placed in this encounter.    *If you need refills on other medications prior to your next appointment, please contact your pharmacy*  Follow-Up: Call back or seek an in-person evaluation if the symptoms worsen or if the condition fails to improve as anticipated.  [facility] [phone]  Other Instructions Please take antibiotic as directed.  Increase fluid intake.  Use Saline nasal spray.  Take a daily multivitamin. Use the Flonase as directed. Ok to continue your current OTC medications.  Place a humidifier in the bedroom.  Please call or return clinic if symptoms are not improving.  Sinusitis Sinusitis is redness, soreness, and swelling (inflammation) of the paranasal sinuses. Paranasal sinuses are air pockets within the bones of your face (beneath the eyes, the middle of the forehead, or above the eyes). In healthy paranasal sinuses, mucus is able to drain out, and air is able to circulate through them by way of your nose. However, when your paranasal sinuses are inflamed, mucus and air can become trapped. This can allow bacteria and other germs to grow and cause infection. Sinusitis can develop quickly and last only a short time (acute) or continue over a long period (chronic). Sinusitis that lasts for more than 12 weeks is considered chronic.  CAUSES  Causes of sinusitis include: Allergies. Structural abnormalities, such as  displacement of the cartilage that separates your nostrils (deviated septum), which can decrease the air flow through your nose and sinuses and affect sinus drainage. Functional abnormalities, such as when the small hairs (cilia) that line your sinuses and help remove mucus do not work properly or are not present. SYMPTOMS  Symptoms of acute and chronic sinusitis are the same. The primary symptoms are pain and pressure around the affected sinuses. Other symptoms include: Upper  toothache. Earache. Headache. Bad breath. Decreased sense of smell and taste. A cough, which worsens when you are lying flat. Fatigue. Fever. Thick drainage from your nose, which often is green and may contain pus (purulent). Swelling and warmth over the affected sinuses. DIAGNOSIS  Your caregiver will perform a physical exam. During the exam, your caregiver may: Look in your nose for signs of abnormal growths in your nostrils (nasal polyps). Tap over the affected sinus to check for signs of infection. View the inside of your sinuses (endoscopy) with a special imaging device with a light attached (endoscope), which is inserted into your sinuses. If your caregiver suspects that you have chronic sinusitis, one or more of the following tests may be recommended: Allergy tests. Nasal culture A sample of mucus is taken from your nose and sent to a lab and screened for bacteria. Nasal cytology A sample of mucus is taken from your nose and examined by your caregiver to determine if your sinusitis is related to an allergy. TREATMENT  Most cases of acute sinusitis are related to a viral infection and will resolve on their own within 10 days. Sometimes medicines are prescribed to help relieve symptoms (pain medicine, decongestants, nasal steroid sprays, or saline sprays).  However, for sinusitis related to a bacterial infection, your caregiver will prescribe antibiotic medicines. These are medicines that will help kill the bacteria causing the infection.  Rarely, sinusitis is caused by a fungal infection. In theses cases, your caregiver will prescribe antifungal medicine. For some cases of chronic sinusitis, surgery is needed. Generally, these are cases in which sinusitis recurs more than 3 times per year, despite other treatments. HOME CARE INSTRUCTIONS  Drink plenty of water. Water helps thin the mucus so your sinuses can drain more easily. Use a humidifier. Inhale steam 3 to 4 times a day (for  example, sit in the bathroom with the shower running). Apply a warm, moist washcloth to your face 3 to 4 times a day, or as directed by your caregiver. Use saline nasal sprays to help moisten and clean your sinuses. Take over-the-counter or prescription medicines for pain, discomfort, or fever only as directed by your caregiver. SEEK IMMEDIATE MEDICAL CARE IF: You have increasing pain or severe headaches. You have nausea, vomiting, or drowsiness. You have swelling around your face. You have vision problems. You have a stiff neck. You have difficulty breathing. MAKE SURE YOU:  Understand these instructions. Will watch your condition. Will get help right away if you are not doing well or get worse. Document Released: 11/29/2005 Document Revised: 02/21/2012 Document Reviewed: 12/14/2011 [facility] Patient Information 2014 [place], [place].    If you have been instructed to have an in-person evaluation today at a local Urgent Care facility, please use the link below. It will take you to a list of all of our available [facility], including address, phone number and hours of operation. Please do not delay care.  [facility]  If you or a family member do not have a primary care provider, use  the link below to schedule a visit and establish care. When you choose a [facility] primary care physician or advanced practice provider, you gain a long-term partner in health. Find a Primary Care Provider  Learn more about [facility] in-office and virtual care options:  - Get Care Now

## 2024-02-25 ENCOUNTER — Encounter: Payer: Self-pay | Admitting: Medical-Surgical

## 2024-02-28 MED ORDER — DOXYCYCLINE MONOHYDRATE 100 MG PO CAPS: 100.0000 mg | ORAL_CAPSULE | Freq: Two times a day (BID) | ORAL | 0 refills | Status: DC

## 2024-03-30 ENCOUNTER — Encounter (HOSPITAL_COMMUNITY): Payer: Self-pay | Admitting: Psychiatry

## 2024-03-30 ENCOUNTER — Telehealth (HOSPITAL_COMMUNITY): Payer: 59 | Admitting: Psychiatry

## 2024-03-30 DIAGNOSIS — M797 Fibromyalgia: Secondary | ICD-10-CM

## 2024-03-30 DIAGNOSIS — F341 Dysthymic disorder: Secondary | ICD-10-CM

## 2024-03-30 DIAGNOSIS — F411 Generalized anxiety disorder: Secondary | ICD-10-CM | POA: Diagnosis not present

## 2024-03-30 MED ORDER — ALPRAZOLAM 0.5 MG PO TABS: 0.5000 mg | ORAL_TABLET | Freq: Every day | ORAL | 2 refills | Status: DC | PRN

## 2024-03-30 MED ORDER — BUPROPION HCL 100 MG PO TABS: 100.0000 mg | ORAL_TABLET | Freq: Every day | ORAL | 0 refills | Status: DC

## 2024-03-30 NOTE — Progress Notes (Signed)
 Patient ID: Martha Roberts, female   DOB: February 10, 1961, 63 y.o.   MRN: [id number]  [facility] Outpatient Follow up visit   Patient Identification: Martha Roberts MRN:  [id number] Date of Evaluation:  03/30/2024 Referral Source: Dr. Loetta Ringer , Primary care Chief Complaint:    depression follow up  Visit Diagnosis:    ICD-10-CM   1. GAD (generalized anxiety disorder)  F41.1     2. Fibromyalgia  M79.7     3. Dysthymia  F34.1     Virtual Visit via Video Note  I connected with Martha Roberts on 03/30/24 at  9:30 AM EDT by a video enabled telemedicine application and verified that I am speaking with the correct person using two identifiers.  Location: Patient: home Provider: home office   I discussed the limitations of evaluation and management by telemedicine and the availability of in person appointments. The patient expressed understanding and agreed to proceed.    I discussed the assessment and treatment plan with the patient. The patient was provided an opportunity to ask questions and all were answered. The patient agreed with the plan and demonstrated an understanding of the instructions.   The patient was advised to call back or seek an in-person evaluation if the symptoms worsen or if the condition fails to improve as anticipated.  I provided 18 minutes of non-face-to-face time during this encounter.      History of Present Illness:    Lives alone, doing fair. Lost her dog but now got another one Overall depression is manageable and tries to spend time outside gardening.  Her daughter is good support  Xanax  daily understand the risk but helps with anxiety and keep her better  Modifying factor : gardeniing when can Severity : manageable   previous Psychotropic Medications: Yes    Family Psychiatric History: sister, father. Has anxiety and panic attacks  Family History:  Family History  Problem Relation Age of Onset   Cancer Mother    Depression Mother    Early death  Mother    Hypertension Mother    Diabetes Father    Anxiety disorder Father    Alcohol abuse Father    Anxiety disorder Sister    Anxiety disorder Brother    Alcohol abuse Brother    Cancer Brother    ADD / ADHD Daughter    Anxiety disorder Daughter    Asthma Daughter    Hypertension Daughter    Learning disabilities Daughter    ADD / ADHD Son    Asthma Son     Social History:   Social History   Socioeconomic History   Marital status: Divorced    Spouse name: Not on file   Number of children: Not on file   Years of education: Not on file   Highest education level: Not on file  Occupational History   Not on file  Tobacco Use   Smoking status: Every Day    Current packs/day: 1.50    Average packs/day: 1.4 packs/day for 50.0 years (67.5 ttl pk-yrs)    Types: Cigarettes   Smokeless tobacco: Never   Tobacco comments:    patch, e-cigarettes  Vaping Use   Vaping status: Never Used  Substance and Sexual Activity   Alcohol use: No    Alcohol/week: 1.0 standard drink of alcohol    Types: 1 Glasses of wine per week   Drug use: No   Sexual activity: Not Currently    Birth control/protection: Abstinence  Other Topics Concern  Not on file  Social History Narrative   ** Merged History Encounter **       ** Merged History Encounter **       Social Drivers of Corporate investment banker Strain: Low Risk  (03/23/2023)   Received from [facility], [facility]   Overall Financial Resource Strain (CARDIA)    Difficulty of Paying Living Expenses: Not very hard  Food Insecurity: Food Insecurity Present (03/23/2023)   Received from [facility], [facility]   Hunger Vital Sign    Worried About Running Out of Food in the Last Year: Sometimes true    Ran Out of Food in the Last Year: Sometimes true  Transportation Needs: Unmet Transportation Needs (03/23/2023)   Received from [facility], [facility]   PRAPARE - Transportation    Lack of Transportation (Medical):  Yes    Lack of Transportation (Non-Medical): Yes  Physical Activity: Unknown (03/23/2023)   Received from [facility], [facility]   Exercise Vital Sign    Days of Exercise per Week: Patient declined    Minutes of Exercise per Session: Not on file  Stress: Stress Concern Present (03/23/2023)   Received from [facility] [place] [facility] Occupational [facility] - Occupational Stress Questionnaire    Feeling of Stress : To some extent  Social Connections: Socially Isolated (03/23/2023)   Received from [facility], [facility]   Social Network    How would you rate your social network (family, work, friends)?: Little participation, lonely and socially isolated    Allergies:   Allergies  Allergen Reactions   Moxifloxacin Hives   Sulfa Antibiotics Hives   Trimethoprim Hives   Amoxicillin -Pot Clavulanate Nausea And Vomiting   Sulfamethoxazole-Trimethoprim Itching    Metabolic Disorder Labs: No results found for: "HGBA1C", "MPG" No results found for: "PROLACTIN" No results found for: "CHOL", "TRIG", "HDL", "CHOLHDL", "VLDL", "LDLCALC"   Current Medications: Current Outpatient Medications  Medication Sig Dispense Refill   ALPRAZolam  (XANAX ) 0.5 MG tablet Take 1 tablet (0.5 mg total) by mouth daily as needed for anxiety. 30 tablet 2   amLODipine (NORVASC) 5 MG tablet Take 5 mg by mouth daily.     atenolol (TENORMIN) 25 MG tablet Take by mouth.     buPROPion  (WELLBUTRIN ) 100 MG tablet TAKE 1 TABLET BY MOUTH DAILY 100 tablet 2   cholestyramine  (QUESTRAN ) 4 g packet Take 1 packet (4 g total) by mouth 2 (two) times daily with a meal. 60 each 12   doxycycline  (MONODOX ) 100 MG capsule Take 1 capsule (100 mg total) by mouth 2 (two) times daily. 8 capsule 0   fluticasone  (FLONASE ) 50 MCG/ACT nasal spray Place 2 sprays into both nostrils daily. 16 g 0   gabapentin  (NEURONTIN ) 100 MG capsule TAKE 2 at night 120 capsule 0   omeprazole (PRILOSEC) 20 MG capsule TK 1 C PO  QD  11   rosuvastatin  (CRESTOR ) 20 MG tablet Take 1 tablet (20 mg total) by mouth daily. 90 tablet 3   SYMBICORT 160-4.5 MCG/ACT inhaler INL 2 PFS PO BID  6   No current facility-administered medications for this visit.      Psychiatric Specialty Exam: Review of Systems  Musculoskeletal:  Positive for myalgias.  Neurological:  Negative for tremors.  Psychiatric/Behavioral:  Negative for substance abuse and suicidal ideas.     There were no vitals taken for this visit.There is no height or weight on file to calculate BMI.  General Appearance: casual  Eye Contact:  fair  Speech:  Normal Rate  Volume:  Decreased  Mood:fair  Affect: congruent  Thought Process:  Coherent  Orientation:  Full (Time, Place, and Person)  Thought Content: clear  Suicidal Thoughts:  No  Homicidal Thoughts:  No  Memory:  Immediate;   Fair Recent;   Fair  Judgement:  Fair  Insight:  fair  Psychomotor Activity:  Normal  Concentration:  Concentration: Fair and Attention Span: Fair  Recall:  [employer] of Knowledge:Fair  Language: Fair  Akathisia:  Negative  Handed:  Right  AIMS (if indicated):    Assets:  Communication Skills Desire for Improvement Social Support  ADL's:  Intact  Cognition: WNL  Sleep:  fair    Treatment Plan Summary: Plan as folows  Prior documentation reviewed   1. Depression: manageable continue wellbutrin  2. GAD: worries fluctuate, has support continue xanax , also on gaba at night  3. Ptsd: baseline, not dwelling on past, conitnue coping skills and xanax  prn   Fu 75m. Follow up with providers for medical co morbidities  Refills due were sent Collaboration of Care: Other patient has follow up appointment with deramtologist to assess her hand condition or dermatitis  Patient/Guardian was advised Release of Information must be obtained prior to any record release in order to collaborate their care with an outside provider. Patient/Guardian was advised if they have not  already done so to contact the registration department to sign all necessary forms in order for us  to release information regarding their care.   Consent: Patient/Guardian gives verbal consent for treatment and assignment of benefits for services provided during this visit. Patient/Guardian expressed understanding and agreed to proceed.    Fu 2-65m.  Wray Heady, MD 4/18/20259:30 AM

## 2024-04-04 ENCOUNTER — Other Ambulatory Visit (HOSPITAL_COMMUNITY): Payer: Self-pay | Admitting: Psychiatry

## 2024-05-14 ENCOUNTER — Encounter: Payer: Self-pay | Admitting: Medical-Surgical

## 2024-05-14 MED ORDER — ATENOLOL 25 MG PO TABS: 25.0000 mg | ORAL_TABLET | Freq: Every day | ORAL | 1 refills | Status: DC

## 2024-06-13 ENCOUNTER — Other Ambulatory Visit (HOSPITAL_COMMUNITY): Payer: Self-pay | Admitting: Psychiatry

## 2024-06-29 ENCOUNTER — Telehealth (HOSPITAL_COMMUNITY): Admitting: Psychiatry

## 2024-07-10 DIAGNOSIS — N3000 Acute cystitis without hematuria: Secondary | ICD-10-CM | POA: Diagnosis not present

## 2024-08-03 ENCOUNTER — Telehealth (INDEPENDENT_AMBULATORY_CARE_PROVIDER_SITE_OTHER): Admitting: Psychiatry

## 2024-08-03 ENCOUNTER — Encounter (HOSPITAL_COMMUNITY): Payer: Self-pay | Admitting: Psychiatry

## 2024-08-03 DIAGNOSIS — F4310 Post-traumatic stress disorder, unspecified: Secondary | ICD-10-CM | POA: Diagnosis not present

## 2024-08-03 DIAGNOSIS — F411 Generalized anxiety disorder: Secondary | ICD-10-CM | POA: Diagnosis not present

## 2024-08-03 DIAGNOSIS — F32A Depression, unspecified: Secondary | ICD-10-CM

## 2024-08-03 DIAGNOSIS — M797 Fibromyalgia: Secondary | ICD-10-CM

## 2024-08-03 DIAGNOSIS — F341 Dysthymic disorder: Secondary | ICD-10-CM

## 2024-08-03 MED ORDER — ALPRAZOLAM 0.5 MG PO TABS: 0.5000 mg | ORAL_TABLET | Freq: Every day | ORAL | 2 refills | Status: AC | PRN

## 2024-08-03 NOTE — Progress Notes (Signed)
 Patient ID: Martha Roberts, female   DOB: 01-05-1961, 63 y.o.   MRN: [id number]  [facility] Outpatient Follow up visit   Patient Identification: Martha Roberts MRN:  [id number] Date of Evaluation:  08/03/2024 Referral Source: Dr. Burnard , Primary care Chief Complaint:    depression follow up  Visit Diagnosis:    ICD-10-CM   1. GAD (generalized anxiety disorder)  F41.1     2. Fibromyalgia  M79.7     3. Dysthymia  F34.1     4. PTSD (post-traumatic stress disorder)  F43.10     Virtual Visit via Video Note  I connected with Martha Roberts on 08/03/24 at  8:30 AM EDT by a video enabled telemedicine application and verified that I am speaking with the correct person using two identifiers.  Location: Patient: home Provider: home office   I discussed the limitations of evaluation and management by telemedicine and the availability of in person appointments. The patient expressed understanding and agreed to proceed.    I discussed the assessment and treatment plan with the patient. The patient was provided an opportunity to ask questions and all were answered. The patient agreed with the plan and demonstrated an understanding of the instructions.   The patient was advised to call back or seek an in-person evaluation if the symptoms worsen or if the condition fails to improve as anticipated.  I provided 18 minutes of non-face-to-face time during this encounter.      History of Present Illness:   On evaluation patient is doing better and regarding depression and anxiety is manageable with gabapentin  as needed and Xanax  also as needed she takes Wellbutrin  during the daytime for depression  Her daughter has moved a mile away from her and that has helped.  Patient may plan to move out as this phone is 61 years old and it caused her coughing She is dealing with some family stressors but handling it Modifying factor : Gardening Severity : manageable   previous Psychotropic Medications: Yes     Family Psychiatric History: sister, father. Has anxiety and panic attacks  Family History:  Family History  Problem Relation Age of Onset   Cancer Mother    Depression Mother    Early death Mother    Hypertension Mother    Diabetes Father    Anxiety disorder Father    Alcohol abuse Father    Anxiety disorder Sister    Anxiety disorder Brother    Alcohol abuse Brother    Cancer Brother    ADD / ADHD Daughter    Anxiety disorder Daughter    Asthma Daughter    Hypertension Daughter    Learning disabilities Daughter    ADD / ADHD Son    Asthma Son     Social History:   Social History   Socioeconomic History   Marital status: Divorced    Spouse name: Not on file   Number of children: Not on file   Years of education: Not on file   Highest education level: Not on file  Occupational History   Not on file  Tobacco Use   Smoking status: Every Day    Current packs/day: 1.50    Average packs/day: 1.4 packs/day for 50.0 years (67.5 ttl pk-yrs)    Types: Cigarettes   Smokeless tobacco: Never   Tobacco comments:    patch, e-cigarettes  Vaping Use   Vaping status: Never Used  Substance and Sexual Activity   Alcohol use: No  Alcohol/week: 1.0 standard drink of alcohol    Types: 1 Glasses of wine per week   Drug use: No   Sexual activity: Not Currently    Birth control/protection: Abstinence  Other Topics Concern   Not on file  Social History Narrative   ** Merged History Encounter **       ** Merged History Encounter **       Social Drivers of Corporate investment banker Strain: Low Risk  (03/23/2023)   Received from [facility]   Overall Financial Resource Strain (CARDIA)    Difficulty of Paying Living Expenses: Not very hard  Food Insecurity: Food Insecurity Present (03/23/2023)   Received from [facility]   Hunger Vital Sign    Within the past 12 months, you worried that your food would run out before you got the money to buy more.: Sometimes true     Within the past 12 months, the food you bought just didn't last and you didn't have money to get more.: Sometimes true  Transportation Needs: Unmet Transportation Needs (03/23/2023)   Received from [facility] - Transportation    Lack of Transportation (Medical): Yes    Lack of Transportation (Non-Medical): Yes  Physical Activity: Unknown (03/23/2023)   Received from [facility]   Exercise Vital Sign    On average, how many days per week do you engage in moderate to strenuous exercise (like a brisk walk)?: Patient declined    Minutes of Exercise per Session: Not on file  Stress: Stress Concern Present (03/23/2023)   Received from [facility] of Occupational Health - Occupational Stress Questionnaire    Feeling of Stress : To some extent  Social Connections: Socially Isolated (03/23/2023)   Received from [facility]   Social Network    How would you rate your social network (family, work, friends)?: Little participation, lonely and socially isolated    Allergies:   Allergies  Allergen Reactions   Moxifloxacin Hives   Sulfa Antibiotics Hives   Trimethoprim Hives   Amoxicillin -Pot Clavulanate Nausea And Vomiting   Sulfamethoxazole-Trimethoprim Itching    Metabolic Disorder Labs: No results found for: HGBA1C, MPG No results found for: PROLACTIN No results found for: CHOL, TRIG, HDL, CHOLHDL, VLDL, LDLCALC   Current Medications: Current Outpatient Medications  Medication Sig Dispense Refill   ALPRAZolam  (XANAX ) 0.5 MG tablet Take 1 tablet (0.5 mg total) by mouth daily as needed for anxiety. 30 tablet 2   amLODipine (NORVASC) 5 MG tablet Take 5 mg by mouth daily.     atenolol  (TENORMIN ) 25 MG tablet Take 1 tablet (25 mg total) by mouth daily. 90 tablet 1   buPROPion  (WELLBUTRIN ) 100 MG tablet TAKE 1 TABLET BY MOUTH DAILY 100 tablet 0   cholestyramine  (QUESTRAN ) 4 g packet Take 1 packet (4 g total) by mouth 2 (two) times daily  with a meal. 60 each 12   doxycycline  (MONODOX ) 100 MG capsule Take 1 capsule (100 mg total) by mouth 2 (two) times daily. 8 capsule 0   fluticasone  (FLONASE ) 50 MCG/ACT nasal spray Place 2 sprays into both nostrils daily. 16 g 0   gabapentin  (NEURONTIN ) 100 MG capsule TAKE 2 at night 120 capsule 0   omeprazole (PRILOSEC) 20 MG capsule TK 1 C PO QD  11   rosuvastatin  (CRESTOR ) 20 MG tablet Take 1 tablet (20 mg total) by mouth daily. 90 tablet 3   SYMBICORT 160-4.5 MCG/ACT inhaler INL 2 PFS PO BID  6   No current facility-administered medications for this visit.      Psychiatric Specialty Exam: Review of Systems  Cardiovascular:  Negative for chest pain.  Musculoskeletal:  Positive for myalgias.  Neurological:  Negative for tremors.  Psychiatric/Behavioral:  Negative for substance abuse and suicidal ideas.     There were no vitals taken for this visit.There is no height or weight on file to calculate BMI.  General Appearance: casual  Eye Contact:  fair  Speech:  Normal Rate  Volume:  Decreased  Mood:fair  Affect: congruent  Thought Process:  Coherent  Orientation:  Full (Time, Place, and Person)  Thought Content: clear  Suicidal Thoughts:  No  Homicidal Thoughts:  No  Memory:  Immediate;   Fair Recent;   Fair  Judgement:  Fair  Insight:  fair  Psychomotor Activity:  Normal  Concentration:  Concentration: Fair and Attention Span: Fair  Recall:  [employer] of Knowledge:Fair  Language: Fair  Akathisia:  Negative  Handed:  Right  AIMS (if indicated):    Assets:  Communication Skills Desire for Improvement Social Support  ADL's:  Intact  Cognition: WNL  Sleep:  fair    Treatment Plan Summary: Plan as folows  Prior documentation reviewed   1. Depression: Manageable continue Wellbutrin   2. GAD: Worries related with family stress but overall finding it reasonable continue gabapentin  at night and Xanax  as needed   3. Ptsd: baseline, not dwelling on past, conitnue  coping skills and xanax  prn   Fu 29m.  Reviewed medication questions addressed  refills due were sent Collaboration of Care: Other patient has follow up appointment with deramtologist to assess her hand condition or dermatitis  Patient/Guardian was advised Release of Information must be obtained prior to any record release in order to collaborate their care with an outside provider. Patient/Guardian was advised if they have not already done so to contact the registration department to sign all necessary forms in order for us  to release information regarding their care.   Consent: Patient/Guardian gives verbal consent for treatment and assignment of benefits for services provided during this visit. Patient/Guardian expressed understanding and agreed to proceed.    Fu 2-30m.  Jackey Flight, MD 8/22/20258:40 AM

## 2024-08-14 DIAGNOSIS — L03113 Cellulitis of right upper limb: Secondary | ICD-10-CM | POA: Diagnosis not present

## 2024-08-14 DIAGNOSIS — S50861A Insect bite (nonvenomous) of right forearm, initial encounter: Secondary | ICD-10-CM | POA: Diagnosis not present

## 2024-08-22 ENCOUNTER — Other Ambulatory Visit (HOSPITAL_COMMUNITY): Payer: Self-pay | Admitting: Psychiatry

## 2024-09-16 ENCOUNTER — Encounter: Payer: Self-pay | Admitting: Medical-Surgical

## 2024-10-03 ENCOUNTER — Ambulatory Visit (INDEPENDENT_AMBULATORY_CARE_PROVIDER_SITE_OTHER): Admitting: Medical-Surgical

## 2024-10-03 VITALS — BP 104/69 | HR 75 | Resp 20 | Ht 59.0 in | Wt 121.0 lb

## 2024-10-03 DIAGNOSIS — Z1239 Encounter for other screening for malignant neoplasm of breast: Secondary | ICD-10-CM

## 2024-10-03 DIAGNOSIS — Z720 Tobacco use: Secondary | ICD-10-CM | POA: Diagnosis not present

## 2024-10-03 DIAGNOSIS — E782 Mixed hyperlipidemia: Secondary | ICD-10-CM | POA: Diagnosis not present

## 2024-10-03 DIAGNOSIS — I10 Essential (primary) hypertension: Secondary | ICD-10-CM | POA: Diagnosis not present

## 2024-10-03 DIAGNOSIS — Z0000 Encounter for general adult medical examination without abnormal findings: Secondary | ICD-10-CM | POA: Diagnosis not present

## 2024-10-03 DIAGNOSIS — R21 Rash and other nonspecific skin eruption: Secondary | ICD-10-CM | POA: Diagnosis not present

## 2024-10-03 DIAGNOSIS — F4001 Agoraphobia with panic disorder: Secondary | ICD-10-CM | POA: Diagnosis not present

## 2024-10-03 MED ORDER — ATENOLOL 50 MG PO TABS: 50.0000 mg | ORAL_TABLET | Freq: Every day | ORAL | 1 refills | Status: AC

## 2024-10-03 MED ORDER — TRIAMCINOLONE ACETONIDE 0.1 % EX CREA: 1.0000 | TOPICAL_CREAM | Freq: Two times a day (BID) | CUTANEOUS | 0 refills | Status: DC

## 2024-10-03 NOTE — Progress Notes (Signed)
 Complete physical exam  Patient: Martha Roberts   DOB: February 24, 1961   64 y.o. Female  MRN: [id number]  Subjective:    Chief Complaint  Patient presents with   Annual Exam   Martha Roberts is a 63 y.o. female who presents today for a complete physical exam. She reports consuming a general diet. The patient does not participate in regular exercise at present. She generally feels fairly well. She reports sleeping poorly. She does not have additional problems to discuss today.    Most recent fall risk assessment:    10/03/2024   10:33 AM  Fall Risk   Falls in the past year? 1  Number falls in past yr: 1  Injury with Fall? 0  Risk for fall due to : Impaired mobility;Impaired balance/gait;Medication side effect     Most recent depression screenings:    10/03/2024   10:34 AM 12/27/2023    9:59 AM  PHQ 2/9 Scores  PHQ - 2 Score 6 6  PHQ- 9 Score 16 16    Vision:Within last year and Dental: No current dental problems and Receives regular dental care    Patient Care Team: Willo Mini, NP as PCP - General (Nurse Practitioner)   Outpatient Medications Prior to Visit  Medication Sig   ALPRAZolam  (XANAX ) 0.5 MG tablet Take 1 tablet (0.5 mg total) by mouth daily as needed for anxiety.   amLODipine (NORVASC) 5 MG tablet Take 5 mg by mouth daily.   buPROPion  (WELLBUTRIN ) 100 MG tablet TAKE 1 TABLET BY MOUTH DAILY   cholestyramine  (QUESTRAN ) 4 g packet Take 1 packet (4 g total) by mouth 2 (two) times daily with a meal.   fluticasone  (FLONASE ) 50 MCG/ACT nasal spray Place 2 sprays into both nostrils daily.   gabapentin  (NEURONTIN ) 100 MG capsule TAKE 2 at night   omeprazole (PRILOSEC) 20 MG capsule TK 1 C PO QD   rosuvastatin  (CRESTOR ) 20 MG tablet Take 1 tablet (20 mg total) by mouth daily.   SYMBICORT 160-4.5 MCG/ACT inhaler INL 2 PFS PO BID   [DISCONTINUED] atenolol  (TENORMIN ) 25 MG tablet Take 1 tablet (25 mg total) by mouth daily.   [DISCONTINUED] doxycycline  (MONODOX ) 100 MG  capsule Take 1 capsule (100 mg total) by mouth 2 (two) times daily.   No facility-administered medications prior to visit.    Review of Systems  Constitutional:  Positive for malaise/fatigue. Negative for chills, fever and weight loss.  HENT:  Negative for congestion, ear pain, hearing loss, sinus pain and sore throat.   Eyes:  Negative for blurred vision, photophobia and pain.  Respiratory:  Negative for cough, shortness of breath and wheezing.   Cardiovascular:  Negative for chest pain, palpitations and leg swelling.  Gastrointestinal:  Positive for heartburn. Negative for abdominal pain, constipation, diarrhea, nausea and vomiting.  Genitourinary:  Negative for dysuria, frequency and urgency.  Musculoskeletal:  Positive for joint pain and myalgias. Negative for falls and neck pain.  Skin:  Negative for itching and rash.  Neurological:  Negative for dizziness, weakness and headaches.  Endo/Heme/Allergies:  Negative for polydipsia. Does not bruise/bleed easily.  Psychiatric/Behavioral:  Negative for depression, substance abuse and suicidal ideas. The patient is not nervous/anxious.      Objective:     BP 104/69 (BP Location: Left Arm, Cuff Size: Small)   Pulse 75   Resp 20   Ht 4' 11 (1.499 m)   Wt 121 lb (54.9 kg)   SpO2 98%   BMI 24.44 kg/m  Physical Exam Constitutional:      General: She is not in acute distress.    Appearance: Normal appearance. She is not ill-appearing.  HENT:     Head: Normocephalic and atraumatic.     Right Ear: Tympanic membrane, ear canal and external ear normal. There is no impacted cerumen.     Left Ear: Tympanic membrane, ear canal and external ear normal. There is no impacted cerumen.     Nose: Nose normal. No congestion or rhinorrhea.     Mouth/Throat:     Mouth: Mucous membranes are moist.     Pharynx: No oropharyngeal exudate or posterior oropharyngeal erythema.  Eyes:     General: No scleral icterus.       Right eye: No discharge.         Left eye: No discharge.     Extraocular Movements: Extraocular movements intact.     Conjunctiva/sclera: Conjunctivae normal.     Pupils: Pupils are equal, round, and reactive to light.  Neck:     Thyroid: No thyromegaly.     Vascular: No carotid bruit or JVD.     Trachea: Trachea normal.  Cardiovascular:     Rate and Rhythm: Normal rate and regular rhythm.     Pulses: Normal pulses.     Heart sounds: Normal heart sounds. No murmur heard.    No friction rub. No gallop.  Pulmonary:     Effort: Pulmonary effort is normal. No respiratory distress.     Breath sounds: Normal breath sounds. No wheezing.  Abdominal:     General: Bowel sounds are normal. There is no distension.     Palpations: Abdomen is soft.     Tenderness: There is abdominal tenderness (Generalized). There is no guarding.  Musculoskeletal:        General: Normal range of motion.     Cervical back: Normal range of motion and neck supple.  Lymphadenopathy:     Cervical: No cervical adenopathy.  Skin:    General: Skin is warm and dry.     Findings: Rash (Dark pink, dry, peeling circular rash to the inner right forearm approximately 3 cm round) present.  Neurological:     Mental Status: She is alert and oriented to person, place, and time.     Cranial Nerves: No cranial nerve deficit.  Psychiatric:        Mood and Affect: Mood normal.        Behavior: Behavior normal.        Thought Content: Thought content normal.        Judgment: Judgment normal.      No results found for any visits on 10/03/24.     Assessment & Plan:    Routine Health Maintenance and Physical Exam   There is no immunization history on file for this patient.  Health Maintenance  Topic Date Due   Cervical Cancer Screening (HPV/Pap Cotest)  Never done   Mammogram  Never done   Colonoscopy  Never done   Lung Cancer Screening  Never done   COVID-19 Vaccine (1 - 2025-26 season) 10/19/2024 (Originally 08/13/2024)   Hepatitis C Screening   12/26/2024 (Originally 01/20/1981)   HIV Screening  12/26/2024 (Originally 01/20/1978)   Zoster Vaccines- Shingrix (1 of 2) 01/03/2025 (Originally 01/20/2013)   Influenza Vaccine  03/12/2025 (Originally 07/13/2024)   DTaP/Tdap/Td (1 - Tdap) 10/03/2025 (Originally 01/20/1982)   Pneumococcal Vaccine: 50+ Years (1 of 2 - PCV) 10/03/2025 (Originally 01/20/1982)   Medicare Annual Wellness (AWV)  10/03/2025   Hepatitis B Vaccines 19-59 Average Risk  Aged Out   HPV VACCINES  Aged Out   Meningococcal B Vaccine  Aged Out    Discussed health benefits of physical activity, and encouraged her to engage in regular exercise appropriate for her age and condition.  1. Agoraphobia with panic disorder (Primary) Managed by psychiatry.  2. Combined fat and carbohydrate induced hyperlipemia Checking lipid panel today. - Lipid panel  3. Tobacco abuse Discussed recommendations for tobacco cessation.  She qualifies for lung cancer screening so ordering today. - CT CHEST LUNG CA SCREEN LOW DOSE W/O CM; Future - CBC with Differential/Platelet  4. Annual physical exam Checking labs as below.  Vision and dental care up-to-date however would recommend updating colonoscopy, mammogram, and Pap smear. Wellness information provided with AVS. - CMP14+EGFR - CBC with Differential/Platelet - Lipid panel  5. Encounter for breast cancer screening using non-mammogram modality History of intolerance to mammogram secondary to fibromyalgia and generalized pain.  Previously had MRI of the breasts however has had difficulty with scheduling, transport, and agoraphobia/claustrophobia.  Reordering today. - MR BREAST BILATERAL WO CONTRAST; Future  6. Rash Lingering rash after what was suspected to be a spider bite.  Was treated with Keflex however the rash did not fully resolved.  Adding triamcinolone twice daily x 14 days. - triamcinolone cream (KENALOG) 0.1 %; Apply 1 Application topically 2 (two) times daily.  Dispense: 30 g; Refill:  0  7. Primary hypertension Blood pressure well-controlled.  Continue atenolol  50 mg daily.  Amlodipine managed by cardiology. - atenolol  (TENORMIN ) 50 MG tablet; Take 1 tablet (50 mg total) by mouth daily.  Dispense: 90 tablet; Refill: 1  Return in 1 year (on 10/03/2025) for Annual medicare wellness; 6 month follow up for chronic disease management.   Keawe Marcello, NP

## 2024-10-03 NOTE — Progress Notes (Signed)
 Subjective:   Martha Roberts is a 62 y.o. female who presents for Medicare Annual (Subsequent) preventive examination.  Visit Complete: In person  Patient Medicare AWV questionnaire was completed by the patient on 10/03/24; I have confirmed that all information answered by patient is correct and no changes since this date.  Cardiac Risk Factors include: dyslipidemia;hypertension     Objective:    Today's Vitals   10/03/24 1001  BP: 104/69  Pulse: 75  Resp: 20  SpO2: 98%  Weight: 121 lb (54.9 kg)  Height: 4' 11 (1.499 m)   Body mass index is 24.44 kg/m.     10/03/2024   10:33 AM  Advanced Directives  Does Patient Have a Medical Advance Directive? No  Would patient like information on creating a medical advance directive? Yes (MAU/Ambulatory/Procedural Areas - Information given)    Current Medications (verified) Outpatient Encounter Medications as of 10/03/2024  Medication Sig   ALPRAZolam  (XANAX ) 0.5 MG tablet Take 1 tablet (0.5 mg total) by mouth daily as needed for anxiety.   amLODipine (NORVASC) 5 MG tablet Take 5 mg by mouth daily.   buPROPion  (WELLBUTRIN ) 100 MG tablet TAKE 1 TABLET BY MOUTH DAILY   cholestyramine  (QUESTRAN ) 4 g packet Take 1 packet (4 g total) by mouth 2 (two) times daily with a meal.   fluticasone  (FLONASE ) 50 MCG/ACT nasal spray Place 2 sprays into both nostrils daily.   gabapentin  (NEURONTIN ) 100 MG capsule TAKE 2 at night   omeprazole (PRILOSEC) 20 MG capsule TK 1 C PO QD   rosuvastatin  (CRESTOR ) 20 MG tablet Take 1 tablet (20 mg total) by mouth daily.   SYMBICORT 160-4.5 MCG/ACT inhaler INL 2 PFS PO BID   triamcinolone cream (KENALOG) 0.1 % Apply 1 Application topically 2 (two) times daily.   [DISCONTINUED] atenolol  (TENORMIN ) 25 MG tablet Take 1 tablet (25 mg total) by mouth daily.   [DISCONTINUED] doxycycline  (MONODOX ) 100 MG capsule Take 1 capsule (100 mg total) by mouth 2 (two) times daily.   atenolol  (TENORMIN ) 50 MG tablet Take 1  tablet (50 mg total) by mouth daily.   [DISCONTINUED] amitriptyline  (ELAVIL ) 10 MG tablet Take 1 tablet (10 mg total) by mouth at bedtime.   [DISCONTINUED] escitalopram  (LEXAPRO ) 5 MG tablet Take 1 tablet (5 mg total) by mouth daily. (Patient not taking: Reported on 05/19/2016)   [DISCONTINUED] mirtazapine  (REMERON ) 7.5 MG tablet Take 1 tablet (7.5 mg total) by mouth at bedtime.   [DISCONTINUED] traZODone (DESYREL) 50 MG tablet TK 1 T PO TID   [DISCONTINUED] venlafaxine  XR (EFFEXOR -XR) 37.5 MG 24 hr capsule Take 1 capsule (37.5 mg total) by mouth daily with breakfast.   No facility-administered encounter medications on file as of 10/03/2024.    Allergies (verified) Moxifloxacin, Sulfa antibiotics, Trimethoprim, Amoxicillin -pot clavulanate, and Sulfamethoxazole-trimethoprim   History: Past Medical History:  Diagnosis Date   Anxiety    Asthma    Depression    Fibromyalgia    GERD (gastroesophageal reflux disease)    Hypertension    PVC (premature ventricular contraction)    Thrombosis of right cephalic vein 05/25/2020   Thrombus of right radial artery (HCC) 05/25/2020   Past Surgical History:  Procedure Laterality Date   BREAST ENHANCEMENT SURGERY     BREAST SURGERY     CARDIAC CATHETERIZATION     CESAREAN SECTION     CHOLECYSTECTOMY     COSMETIC SURGERY     EYE SURGERY     SINUS EXPLORATION     TUBAL LIGATION  Family History  Problem Relation Age of Onset   Cancer Mother    Depression Mother    Early death Mother    Hypertension Mother    Diabetes Father    Anxiety disorder Father    Alcohol abuse Father    Anxiety disorder Sister    Anxiety disorder Brother    Alcohol abuse Brother    Cancer Brother    ADD / ADHD Daughter    Anxiety disorder Daughter    Asthma Daughter    Hypertension Daughter    Learning disabilities Daughter    ADD / ADHD Son    Asthma Son    Social History   Socioeconomic History   Marital status: Divorced    Spouse name: Not on file    Number of children: Not on file   Years of education: Not on file   Highest education level: Some college, no degree  Occupational History   Not on file  Tobacco Use   Smoking status: Every Day    Current packs/day: 1.50    Average packs/day: 1.4 packs/day for 50.0 years (67.5 ttl pk-yrs)    Types: Cigarettes   Smokeless tobacco: Never   Tobacco comments:    patch, e-cigarettes  Vaping Use   Vaping status: Never Used  Substance and Sexual Activity   Alcohol use: No    Alcohol/week: 1.0 standard drink of alcohol    Types: 1 Glasses of wine per week   Drug use: No   Sexual activity: Not Currently    Birth control/protection: Abstinence  Other Topics Concern   Not on file  Social History Narrative   ** Merged History Encounter **       ** Merged History Encounter **       Social Drivers of Corporate investment banker Strain: Low Risk  (03/23/2023)   Received from [facility]   Overall Financial Resource Strain (CARDIA)    Difficulty of Paying Living Expenses: Not very hard  Food Insecurity: No Food Insecurity (10/03/2024)   Hunger Vital Sign    Worried About Running Out of Food in the Last Year: Never true    Ran Out of Food in the Last Year: Never true  Transportation Needs: Unmet Transportation Needs (10/03/2024)   PRAPARE - Administrator, Civil Service (Medical): Yes    Lack of Transportation (Non-Medical): No  Physical Activity: Inactive (10/03/2024)   Exercise Vital Sign    Days of Exercise per Week: 0 days    Minutes of Exercise per Session: Not on file  Stress: Stress Concern Present (10/03/2024)   [name] - Occupational Stress Questionnaire    Feeling of Stress: Very much  Social Connections: Unknown (10/03/2024)   Social Connection and Isolation Panel    Frequency of Communication with Friends and Family: Once a week    Frequency of Social Gatherings with Friends and Family: Not on file    Attends Religious  Services: Never    Database administrator or Organizations: No    Attends Engineer, structural: Not on file    Marital Status: Divorced    Tobacco Counseling Ready to quit: Not Answered Counseling given: Not Answered Tobacco comments: patch, e-cigarettes   Clinical Intake:  Pre-visit preparation completed: Yes  Pain : No/denies pain     Nutritional Status: BMI of 19-24  Normal Diabetes: No  How often do you need to have someone help you when you read instructions, pamphlets,  or other written materials from your doctor or pharmacy?: 2 - Rarely What is the last grade level you completed in school?: Accounting software degree         Activities of Daily Living    10/03/2024   10:31 AM 10/03/2024   12:34 AM  In your present state of health, do you have any difficulty performing the following activities:  Hearing? 0 0  Vision? 0 0  Difficulty concentrating or making decisions? 1 1  Walking or climbing stairs? 1 1  Dressing or bathing? 0 0  Doing errands, shopping? 1 1  Preparing Food and eating ? Y Y  Using the Toilet? N N  In the past six months, have you accidently leaked urine? Y Y  Do you have problems with loss of bowel control? Y Y  Managing your Medications? Y Y  Managing your Finances? N N  Housekeeping or managing your Housekeeping? Martha Roberts Martha Roberts    Patient Care Team: Willo Mini, NP as PCP - General (Nurse Practitioner)  Indicate any recent Medical Services you may have received from other than [facility] providers in the past year (date may be approximate).     Assessment:   This is a routine wellness examination for Martha Roberts.  Hearing/Vision screen No results found.   Goals Addressed             This Visit's Progress    Patient Stated       Would like to aim for exercise two to three weekly.        Depression Screen    10/03/2024   10:34 AM 12/27/2023    9:59 AM  PHQ 2/9 Scores  PHQ - 2 Score 6 6  PHQ- 9 Score 16 16    Fall Risk     10/03/2024   10:33 AM 10/03/2024   12:34 AM  Fall Risk   Falls in the past year? 1 1  Number falls in past yr: 1 1  Injury with Fall? 0 0  Risk for fall due to : Impaired mobility;Impaired balance/gait;Medication side effect     MEDICARE RISK AT HOME: Medicare Risk at Home Any stairs in or around the home?: No Home free of loose throw rugs in walkways, pet beds, electrical cords, etc?: Yes Adequate lighting in your home to reduce risk of falls?: Yes Life alert?: No Use of a cane, walker or w/c?: No Grab bars in the bathroom?: Yes Shower chair or bench in shower?: Yes Elevated toilet seat or a handicapped toilet?: Yes  TIMED UP AND GO:  Was the test performed?  No    Cognitive Function:        10/03/2024   10:35 AM  6CIT Screen  What Year? 0 points  What month? 0 points  What time? 0 points  Count back from 20 0 points  Months in reverse 0 points  Repeat phrase 0 points  Total Score 0 points    Immunizations  There is no immunization history on file for this patient.  TDAP status: Due, Education has been provided regarding the importance of this vaccine. Advised may receive this vaccine at local pharmacy or Health Dept. Aware to provide a copy of the vaccination record if obtained from local pharmacy or Health Dept. Verbalized acceptance and understanding.  Flu Vaccine status: Declined, Education has been provided regarding the importance of this vaccine but patient still declined. Advised may receive this vaccine at local pharmacy or Health Dept. Aware to provide a copy of  the vaccination record if obtained from local pharmacy or Health Dept. Verbalized acceptance and understanding.  Pneumococcal vaccine status: Declined,  Education has been provided regarding the importance of this vaccine but patient still declined. Advised may receive this vaccine at local pharmacy or Health Dept. Aware to provide a copy of the vaccination record if obtained from local pharmacy or  Health Dept. Verbalized acceptance and understanding.   Covid-19 vaccine status: Declined, Education has been provided regarding the importance of this vaccine but patient still declined. Advised may receive this vaccine at local pharmacy or Health Dept.or vaccine clinic. Aware to provide a copy of the vaccination record if obtained from local pharmacy or Health Dept. Verbalized acceptance and understanding.  Qualifies for Shingles Vaccine? No   Zostavax completed No   Shingrix Completed?: No.    Education has been provided regarding the importance of this vaccine. Patient has been advised to call insurance company to determine out of pocket expense if they have not yet received this vaccine. Advised may also receive vaccine at local pharmacy or Health Dept. Verbalized acceptance and understanding.  Screening Tests Health Maintenance  Topic Date Due   Medicare Annual Wellness (AWV)  Never done   Cervical Cancer Screening (HPV/Pap Cotest)  Never done   Mammogram  Never done   Colonoscopy  Never done   Lung Cancer Screening  Never done   COVID-19 Vaccine (1 - 2025-26 season) 10/19/2024 (Originally 08/13/2024)   Hepatitis C Screening  12/26/2024 (Originally 01/20/1981)   HIV Screening  12/26/2024 (Originally 01/20/1978)   Zoster Vaccines- Shingrix (1 of 2) 01/03/2025 (Originally 01/20/2013)   Influenza Vaccine  03/12/2025 (Originally 07/13/2024)   DTaP/Tdap/Td (1 - Tdap) 10/03/2025 (Originally 01/20/1982)   Pneumococcal Vaccine: 50+ Years (1 of 2 - PCV) 10/03/2025 (Originally 01/20/1982)   Hepatitis B Vaccines 19-59 Average Risk  Aged Out   HPV VACCINES  Aged Out   Meningococcal B Vaccine  Aged Out    Health Maintenance  Health Maintenance Due  Topic Date Due   Medicare Annual Wellness (AWV)  Never done   Cervical Cancer Screening (HPV/Pap Cotest)  Never done   Mammogram  Never done   Colonoscopy  Never done   Lung Cancer Screening  Never done    Colorectal cancer screening: Type of screening:  Colonoscopy. Completed 2015. Repeat every 10 years  Mammogram status: Ordered 10/03/2024. Pt provided with contact info and advised to call to schedule appt.   Lung Cancer Screening: (Low Dose CT Chest recommended if Age 13-80 years, 20 pack-year currently smoking OR have quit w/in 15years.) does qualify.   Lung Cancer Screening Referral: placed today  Additional Screening:  Hepatitis C Screening: does not qualify  Vision Screening: Recommended annual ophthalmology exams for early detection of glaucoma and other disorders of the eye. Is the patient up to date with their annual eye exam?  Yes  Who is the provider or what is the name of the office in which the patient attends annual eye exams? [facility] If pt is not established with a provider, would they like to be referred to a provider to establish care? NA.   Dental Screening: Recommended annual dental exams for proper oral hygiene  Community Resource Referral / Chronic Care Management: CRR required this visit?  No   CCM required this visit?  No     Plan:     I have personally reviewed and noted the following in the patient's chart:   Medical and social history Use of  alcohol, tobacco or illicit drugs  Current medications and supplements including opioid prescriptions. Patient is not currently taking opioid prescriptions. Functional ability and status Nutritional status Physical activity Advanced directives List of other physicians Hospitalizations, surgeries, and ER visits in previous 12 months Vitals Screenings to include cognitive, depression, and falls Referrals and appointments  In addition, I have reviewed and discussed with patient certain preventive protocols, quality metrics, and best practice recommendations. A written personalized care plan for preventive services as well as general preventive health recommendations were provided to patient.   Zada Palin, NP   10/03/2024   After Visit Summary: (In  Person-Printed) AVS printed and given to the patient

## 2024-10-04 ENCOUNTER — Ambulatory Visit: Payer: Self-pay | Admitting: Medical-Surgical

## 2024-10-04 LAB — CMP14+EGFR
ALT: 25 IU/L (ref 0–32)
AST: 54 IU/L — ABNORMAL HIGH (ref 0–40)
Albumin: 4.6 g/dL (ref 3.9–4.9)
Alkaline Phosphatase: 137 IU/L — ABNORMAL HIGH (ref 49–135)
BUN/Creatinine Ratio: 9 — ABNORMAL LOW (ref 12–28)
BUN: 7 mg/dL — ABNORMAL LOW (ref 8–27)
Bilirubin Total: 0.6 mg/dL (ref 0.0–1.2)
CO2: 21 mmol/L (ref 20–29)
Calcium: 9.8 mg/dL (ref 8.7–10.3)
Chloride: 104 mmol/L (ref 96–106)
Creatinine, Ser: 0.8 mg/dL (ref 0.57–1.00)
Globulin, Total: 2.6 g/dL (ref 1.5–4.5)
Glucose: 102 mg/dL — ABNORMAL HIGH (ref 70–99)
Potassium: 4.2 mmol/L (ref 3.5–5.2)
Sodium: 140 mmol/L (ref 134–144)
Total Protein: 7.2 g/dL (ref 6.0–8.5)
eGFR: 84 mL/min/1.73 (ref >59)

## 2024-10-04 LAB — CBC WITH DIFFERENTIAL/PLATELET
Basophils Absolute: 0.1 x10E3/uL (ref 0.0–0.2)
Basos: 1 %
EOS (ABSOLUTE): 0.3 x10E3/uL (ref 0.0–0.4)
Eos: 3 %
Hematocrit: 48.8 % — ABNORMAL HIGH (ref 34.0–46.6)
Hemoglobin: 16.1 g/dL — ABNORMAL HIGH (ref 11.1–15.9)
Immature Grans (Abs): 0 x10E3/uL (ref 0.0–0.1)
Immature Granulocytes: 0 %
Lymphocytes Absolute: 3.3 x10E3/uL — ABNORMAL HIGH (ref 0.7–3.1)
Lymphs: 26 %
MCH: 29.8 pg (ref 26.6–33.0)
MCHC: 33 g/dL (ref 31.5–35.7)
MCV: 90 fL (ref 79–97)
Monocytes Absolute: 1 x10E3/uL — ABNORMAL HIGH (ref 0.1–0.9)
Monocytes: 8 %
Neutrophils Absolute: 8 x10E3/uL — ABNORMAL HIGH (ref 1.4–7.0)
Neutrophils: 62 %
Platelets: 300 x10E3/uL (ref 150–450)
RBC: 5.4 x10E6/uL — ABNORMAL HIGH (ref 3.77–5.28)
RDW: 12.4 % (ref 11.7–15.4)
WBC: 12.8 x10E3/uL — ABNORMAL HIGH (ref 3.4–10.8)

## 2024-10-04 LAB — LIPID PANEL
Chol/HDL Ratio: 4.3 ratio (ref 0.0–4.4)
Cholesterol, Total: 164 mg/dL (ref 100–199)
HDL: 38 mg/dL — ABNORMAL LOW (ref >39)
LDL Chol Calc (NIH): 88 mg/dL (ref 0–99)
Triglycerides: 223 mg/dL — ABNORMAL HIGH (ref 0–149)
VLDL Cholesterol Cal: 38 mg/dL (ref 5–40)

## 2024-10-09 NOTE — Progress Notes (Signed)
 Martha Roberts                                          MRN: [id number]   10/09/2024   The [facility] Quality Team Specialist reviewed this patient medical record for the purposes of chart review for care gap closure. The following were reviewed: chart review for care gap closure-breast cancer screening.    [facility] Quality Team

## 2024-10-17 ENCOUNTER — Ambulatory Visit

## 2024-10-22 ENCOUNTER — Telehealth (HOSPITAL_COMMUNITY): Payer: Self-pay | Admitting: Psychiatry

## 2024-10-22 MED ORDER — GABAPENTIN 100 MG PO CAPS
ORAL_CAPSULE | ORAL | 0 refills | Status: AC
Start: 2024-10-22 — End: ?

## 2024-10-22 NOTE — Telephone Encounter (Signed)
 Patient called requesting refill of gabapentin  (NEURONTIN ) 100 MG capsule .  [id number] [address], [place] - [address] AT [facility] [address] Phone: [phone]  Fax: [phone]     Last ordered: 05/29/2024 - 120 capsules Last visit: 08/03/2024 Next visit: 11/02/2024

## 2024-10-30 ENCOUNTER — Telehealth (INDEPENDENT_AMBULATORY_CARE_PROVIDER_SITE_OTHER): Admitting: Medical-Surgical

## 2024-10-30 ENCOUNTER — Encounter: Payer: Self-pay | Admitting: Medical-Surgical

## 2024-10-30 DIAGNOSIS — B9689 Other specified bacterial agents as the cause of diseases classified elsewhere: Secondary | ICD-10-CM

## 2024-10-30 DIAGNOSIS — J019 Acute sinusitis, unspecified: Secondary | ICD-10-CM | POA: Diagnosis not present

## 2024-10-30 MED ORDER — CEFDINIR 300 MG PO CAPS: 300.0000 mg | ORAL_CAPSULE | Freq: Two times a day (BID) | ORAL | 0 refills | Status: DC

## 2024-10-30 MED ORDER — PREDNISONE 50 MG PO TABS: 50.0000 mg | ORAL_TABLET | Freq: Every day | ORAL | 0 refills | Status: AC

## 2024-10-30 NOTE — Progress Notes (Signed)
 Virtual Visit via Video Note  I connected with Martha Roberts on 10/30/24 at 11:10 AM EST by a video enabled telemedicine application and verified that I am speaking with the correct person using two identifiers.   I discussed the limitations of evaluation and management by telemedicine and the availability of in person appointments. The patient expressed understanding and agreed to proceed.  Patient location: home Provider locations: office  Subjective:    Discussed the use of AI scribe software for clinical note transcription with the patient, who gave verbal consent to proceed.  History of Present Illness   Martha Roberts is a 63 year old female with chronic sinus infections who presents with facial pain and sinus drainage.  Chronic and recurrent sinusitis - Chronic sinus infections for approximately 25 years, with frequent recurrences - History of sinus surgery for polyps 25 years ago, without resolution of infections - Current episode ongoing for about one month - Left-sided facial pain - Associated gum pain and eye pain on the left side - Thick nasal drainage with noticeable odor - Nocturnal choking due to drainage - Cough productive of yellow sputum - No fever or chills  Antibiotic use and tolerance - Multiple courses of antibiotics for sinus infections - Doxycycline  loses effectiveness after a few days - Cannot take Augmentin, but can tolerate amoxicillin  - Omnicef  has not been tried - Does not frequently experience yeast infections with antibiotics  Psychological symptoms and barriers to care - General malaise and sadness - Agoraphobia limits ability to seek in-person medical care   Past medical history, Surgical history, Family history not pertinant except as noted below, Social history, Allergies, and medications have been entered into the medical record, reviewed, and corrections made.   Review of Systems: See HPI for pertinent positives and negatives.    Objective:    General: Speaking clearly in complete sentences without any shortness of breath.  Alert and oriented x3.  Normal judgment. No apparent acute distress.  Impression and Recommendations:    Acute bacterial sinusitis Left facial pain, post-nasal drainage, and yellow nasal discharge. Doxycycline  ineffective. Amoxicillin  tolerated. Omnicef  considered for broader coverage. Steroids considered due to severity of inflammation. - Prescribed Omnicef  300mg  BID x 7 days. - Prescribed 5-day course of Prednisone . - Advised to contact if no improvement.    I discussed the assessment and treatment plan with the patient. The patient was provided an opportunity to ask questions and all were answered. The patient agreed with the plan and demonstrated an understanding of the instructions.   The patient was advised to call back or seek an in-person evaluation if the symptoms worsen or if the condition fails to improve as anticipated.  Return if symptoms worsen or fail to improve.  [name], DNP, APRN, FNP-BC [facility]

## 2024-11-02 ENCOUNTER — Encounter (HOSPITAL_COMMUNITY): Payer: Self-pay | Admitting: Psychiatry

## 2024-11-02 ENCOUNTER — Telehealth (INDEPENDENT_AMBULATORY_CARE_PROVIDER_SITE_OTHER): Admitting: Psychiatry

## 2024-11-02 DIAGNOSIS — F411 Generalized anxiety disorder: Secondary | ICD-10-CM

## 2024-11-02 DIAGNOSIS — F341 Dysthymic disorder: Secondary | ICD-10-CM

## 2024-11-02 DIAGNOSIS — F4310 Post-traumatic stress disorder, unspecified: Secondary | ICD-10-CM | POA: Diagnosis not present

## 2024-11-02 DIAGNOSIS — M797 Fibromyalgia: Secondary | ICD-10-CM

## 2024-11-02 NOTE — Progress Notes (Signed)
 Patient ID: Martha Roberts, female   DOB: September 22, 1961, 63 y.o.   MRN: [id number]  [facility] Outpatient Follow up visit   Patient Identification: Martha Roberts MRN:  [id number] Date of Evaluation:  11/02/2024 Referral Source: Dr. Burnard , Primary care Chief Complaint:    depression follow up  Visit Diagnosis:    ICD-10-CM   1. GAD (generalized anxiety disorder)  F41.1     2. Fibromyalgia  M79.7     3. PTSD (post-traumatic stress disorder)  F43.10     4. Dysthymia  F34.1     Virtual Visit via Video Note  I connected with Martha Roberts on 11/02/24 at 10:00 AM EST by a video enabled telemedicine application and verified that I am speaking with the correct person using two identifiers.  Location: Patient: home Provider: home office   I discussed the limitations of evaluation and management by telemedicine and the availability of in person appointments. The patient expressed understanding and agreed to proceed.     I discussed the assessment and treatment plan with the patient. The patient was provided an opportunity to ask questions and all were answered. The patient agreed with the plan and demonstrated an understanding of the instructions.   The patient was advised to call back or seek an in-person evaluation if the symptoms worsen or if the condition fails to improve as anticipated.  I provided 18 minutes of non-face-to-face time during this encounter.       History of Present Illness:   Patient is a 63 years old currently being with her son.  Diagnosed with depression and anxiety with agoraphobia  Symptoms are manageable with gabapentin  and Xanax .  Also takes Wellbutrin   Her house is [address] years old and she is having coughing and also wants to move because landlord not taking care of the house.  She needs a letter for emotional support animal as the dog keeps her busy and occupied distract her from her anxiety  She is dealing with some family stressors but handling  it Modifying factor : Gardening Severity : Manageable   previous Psychotropic Medications: Yes    Family Psychiatric History: sister, father. Has anxiety and panic attacks  Family History:  Family History  Problem Relation Age of Onset   Cancer Mother    Depression Mother    Early death Mother    Hypertension Mother    Diabetes Father    Anxiety disorder Father    Alcohol abuse Father    Anxiety disorder Sister    Anxiety disorder Brother    Alcohol abuse Brother    Cancer Brother    ADD / ADHD Daughter    Anxiety disorder Daughter    Asthma Daughter    Hypertension Daughter    Learning disabilities Daughter    ADD / ADHD Son    Asthma Son     Social History:   Social History   Socioeconomic History   Marital status: Divorced    Spouse name: Not on file   Number of children: Not on file   Years of education: Not on file   Highest education level: Some college, no degree  Occupational History   Not on file  Tobacco Use   Smoking status: Every Day    Current packs/day: 1.50    Average packs/day: 1.4 packs/day for 50.0 years (67.5 ttl pk-yrs)    Types: Cigarettes   Smokeless tobacco: Never   Tobacco comments:    patch, e-cigarettes  Vaping Use  Vaping status: Never Used  Substance and Sexual Activity   Alcohol use: No    Alcohol/week: 1.0 standard drink of alcohol    Types: 1 Glasses of wine per week   Drug use: No   Sexual activity: Not Currently    Birth control/protection: Abstinence  Other Topics Concern   Not on file  Social History Narrative   ** Merged History Encounter **       ** Merged History Encounter **       Social Drivers of Corporate Investment Banker Strain: Low Risk  (03/23/2023)   Received from [facility]   Overall Financial Resource Strain (CARDIA)    Difficulty of Paying Living Expenses: Not very hard  Food Insecurity: No Food Insecurity (10/03/2024)   Hunger Vital Sign    Worried About Running Out of Food in the Last  Year: Never true    Ran Out of Food in the Last Year: Never true  Transportation Needs: Unmet Transportation Needs (10/03/2024)   PRAPARE - Administrator, Civil Service (Medical): Yes    Lack of Transportation (Non-Medical): No  Physical Activity: Inactive (10/03/2024)   Exercise Vital Sign    Days of Exercise per Week: 0 days    Minutes of Exercise per Session: Not on file  Stress: Stress Concern Present (10/03/2024)   [name] - Occupational Stress Questionnaire    Feeling of Stress: Very much  Social Connections: Unknown (10/03/2024)   Social Connection and Isolation Panel    Frequency of Communication with Friends and Family: Once a week    Frequency of Social Gatherings with Friends and Family: Not on file    Attends Religious Services: Never    Database Administrator or Organizations: No    Attends Engineer, Structural: Not on file    Marital Status: Divorced    Allergies:   Allergies  Allergen Reactions   Moxifloxacin Hives   Sulfa Antibiotics Hives   Trimethoprim Hives   Amoxicillin -Pot Clavulanate Nausea And Vomiting   Sulfamethoxazole-Trimethoprim Itching    Metabolic Disorder Labs: No results found for: HGBA1C, MPG No results found for: PROLACTIN Lab Results  Component Value Date   CHOL 164 10/03/2024   TRIG 223 (H) 10/03/2024   HDL 38 (L) 10/03/2024   CHOLHDL 4.3 10/03/2024   LDLCALC 88 10/03/2024     Current Medications: Current Outpatient Medications  Medication Sig Dispense Refill   ALPRAZolam  (XANAX ) 0.5 MG tablet Take 1 tablet (0.5 mg total) by mouth daily as needed for anxiety. 30 tablet 2   amLODipine (NORVASC) 5 MG tablet Take 5 mg by mouth daily.     atenolol  (TENORMIN ) 50 MG tablet Take 1 tablet (50 mg total) by mouth daily. 90 tablet 1   buPROPion  (WELLBUTRIN ) 100 MG tablet TAKE 1 TABLET BY MOUTH DAILY 100 tablet 2   cefdinir  (OMNICEF ) 300 MG capsule Take 1 capsule (300 mg total) by  mouth 2 (two) times daily. 14 capsule 0   fluticasone  (FLONASE ) 50 MCG/ACT nasal spray Place 2 sprays into both nostrils daily. (Patient taking differently: Place 2 sprays into both nostrils as needed for allergies or rhinitis.) 16 g 0   gabapentin  (NEURONTIN ) 100 MG capsule TAKE 2 at night (Patient taking differently: as needed. TAKE 2 at night) 120 capsule 0   omeprazole (PRILOSEC) 20 MG capsule TK 1 C PO QD  11   predniSONE  (DELTASONE ) 50 MG tablet Take 1 tablet (50 mg total) by  mouth daily with breakfast. 5 tablet 0   rosuvastatin  (CRESTOR ) 20 MG tablet Take 1 tablet (20 mg total) by mouth daily. 90 tablet 3   SYMBICORT 160-4.5 MCG/ACT inhaler INL 2 PFS PO BID  6   No current facility-administered medications for this visit.      Psychiatric Specialty Exam: Review of Systems  Cardiovascular:  Negative for chest pain.  Musculoskeletal:  Positive for myalgias.  Neurological:  Negative for tremors.  Psychiatric/Behavioral:  Negative for substance abuse and suicidal ideas.     There were no vitals taken for this visit.There is no height or weight on file to calculate BMI.  General Appearance: casual  Eye Contact:  fair  Speech:  Normal Rate  Volume:  Decreased  Mood:fair  Affect: congruent  Thought Process:  Coherent  Orientation:  Full (Time, Place, and Person)  Thought Content: clear  Suicidal Thoughts:  No  Homicidal Thoughts:  No  Memory:  Immediate;   Fair Recent;   Fair  Judgement:  Fair  Insight:  fair  Psychomotor Activity:  Normal  Concentration:  Concentration: Fair and Attention Span: Fair  Recall:  [employer] of Knowledge:Fair  Language: Fair  Akathisia:  Negative  Handed:  Right  AIMS (if indicated):    Assets:  Communication Skills Desire for Improvement Social Support  ADL's:  Intact  Cognition: WNL  Sleep:  fair    Treatment Plan Summary: Plan as folows  Prior documentation reviewed   1. Depression: Manageable continue Wellbutrin   2. GAD:  Worries related to psychosocial issues including current living situation will write down a letter for emotional support animal for the dog as she is searching for housing continue Xanax  and gabapentin    3. Ptsd: Baseline trying to do all not on the past continue Xanax  as needed also distract from the negative thoughts   Fu 24m.  Reviewed medication questions addressed  refills if due were sent.  Will write down letter for emotional support animal Collaboration of Care: Other patient has follow up appointment with deramtologist to assess her hand condition or dermatitis  Patient/Guardian was advised Release of Information must be obtained prior to any record release in order to collaborate their care with an outside provider. Patient/Guardian was advised if they have not already done so to contact the registration department to sign all necessary forms in order for us  to release information regarding their care.   Consent: Patient/Guardian gives verbal consent for treatment and assignment of benefits for services provided during this visit. Patient/Guardian expressed understanding and agreed to proceed.    Fu 2-65m.  Jackey Flight, MD 11/21/202510:03 AM

## 2024-11-19 ENCOUNTER — Encounter: Payer: Self-pay | Admitting: Medical-Surgical

## 2024-11-19 ENCOUNTER — Telehealth: Admitting: Medical-Surgical

## 2024-11-19 DIAGNOSIS — E041 Nontoxic single thyroid nodule: Secondary | ICD-10-CM

## 2024-11-19 DIAGNOSIS — R197 Diarrhea, unspecified: Secondary | ICD-10-CM

## 2024-11-19 MED ORDER — VANCOMYCIN HCL 125 MG PO CAPS: 125.0000 mg | ORAL_CAPSULE | Freq: Four times a day (QID) | ORAL | 0 refills | Status: AC

## 2024-11-19 NOTE — Progress Notes (Signed)
 Virtual Visit via Video Note  I connected with Martha Roberts on 11/19/24 at 10:30 AM EST by a video enabled telemedicine application and verified that I am speaking with the correct person using two identifiers.   I discussed the limitations of evaluation and management by telemedicine and the availability of in person appointments. The patient expressed understanding and agreed to proceed.  Patient location: home Provider locations: office  Subjective:    Discussed the use of AI scribe software for clinical note transcription with the patient, who gave verbal consent to proceed.  History of Present Illness   Martha Roberts is a 63 year old female who presents with diarrhea and abdominal pain following exposure to C. diff.  Diarrhea and gastrointestinal symptoms - Persistent watery diarrhea since Friday morning following exposure to daughter with C. diff infection - Diarrhea frequent enough to prevent leaving the toilet - Mucus present in stool on Friday night and Saturday - No blood in stool - Abdominal pain began Friday morning, worsened by Friday night to severe intensity - Recurrent vomiting from Friday night until midday Saturday; no vomiting since then - Ongoing nausea without vomiting since Saturday - No current knowledge of fever; intermittent chills reported  Psychiatric symptoms - Significant agoraphobia limiting ability to seek in-person medical care - Only contact has been with daughter and grandchildren  Thyroid nodule - Neck fullness consistent with previously noted thyroid nodule - Concern for underlying thyroid disease  - Overdue for recheck of US     Past medical history, Surgical history, Family history not pertinant except as noted below, Social history, Allergies, and medications have been entered into the medical record, reviewed, and corrections made.   Review of Systems: See HPI for pertinent positives and negatives.   Objective:    General:  Speaking clearly in complete sentences without any shortness of breath.  Alert and oriented x3.  Normal judgment. No apparent acute distress.  Impression and Recommendations:    Assessment and Plan    Diarrhea of presumed infectious origin Acute diarrhea and vomiting post C. difficile exposure. Suspected due to recent antibiotic use and limited exposure. Consider C difficile infection vs viral gastroenteritis. Given exposure and symptoms, treating empirically for C diff but advised if no improvement, will need to run GI profile.  - Prescribed vancomycin  1 capsule QID for 10 days. - Advised symptom monitoring. - Consider stool study GI profile if no improvement.  Thyroid nodule Thyroid nodule with possible dysfunction symptoms. Family history of thyroid issues. - Ordered thyroid blood test and ultrasound.     I discussed the assessment and treatment plan with the patient. The patient was provided an opportunity to ask questions and all were answered. The patient agreed with the plan and demonstrated an understanding of the instructions.   The patient was advised to call back or seek an in-person evaluation if the symptoms worsen or if the condition fails to improve as anticipated.  Return if symptoms worsen or fail to improve.  [name], DNP, APRN, FNP-BC [facility]

## 2024-11-23 ENCOUNTER — Encounter: Payer: Self-pay | Admitting: Medical-Surgical

## 2024-11-23 MED ORDER — FLUCONAZOLE 150 MG PO TABS: 150.0000 mg | ORAL_TABLET | Freq: Once | ORAL | 0 refills | Status: AC

## 2024-11-23 MED ORDER — SYMBICORT 160-4.5 MCG/ACT IN AERO: 2.0000 | INHALATION_SPRAY | Freq: Two times a day (BID) | RESPIRATORY_TRACT | 6 refills | Status: AC

## 2024-11-26 ENCOUNTER — Ambulatory Visit: Payer: Self-pay | Admitting: Medical-Surgical

## 2024-11-26 ENCOUNTER — Telehealth: Payer: Self-pay

## 2024-11-26 ENCOUNTER — Encounter: Admitting: Medical-Surgical

## 2024-11-26 DIAGNOSIS — R8290 Unspecified abnormal findings in urine: Secondary | ICD-10-CM | POA: Diagnosis not present

## 2024-11-26 DIAGNOSIS — R300 Dysuria: Secondary | ICD-10-CM

## 2024-11-26 LAB — POCT URINALYSIS DIP (CLINITEK)
Bilirubin, UA: NEGATIVE
Glucose, UA: 100 mg/dL — AB
Ketones, POC UA: NEGATIVE mg/dL
Nitrite, UA: POSITIVE — AB
POC PROTEIN,UA: 30 — AB
Spec Grav, UA: 1.01 (ref 1.010–1.025)
Urobilinogen, UA: 1 U/dL
pH, UA: 6 (ref 5.0–8.0)

## 2024-11-26 NOTE — Telephone Encounter (Signed)
 I called patient. She will come by for a urine culture. I didn't order a UA because she has taken AZO.

## 2024-11-26 NOTE — Telephone Encounter (Signed)
 Copied from CRM #[id number]. Topic: Appointments - Scheduling Inquiry for Clinic >> Nov 26, 2024  9:24 AM Darshell M wrote: Reason for CRM: Patient scheduled for video visit tomorrow. Patient taking vancomyacin for c-diff since 12/8. Now experiencing UTI symptoms. Patient would like to have video visit today if possible. Patient CB# [phone] >> Nov 26, 2024  1:13 PM Selinda RAMAN wrote: The patient called back and will probably see if the son can come by to get a cup and take it to her for a sample and bring it back. She doesn't know what else to do because she can't stop going and if worried about getting in the car.

## 2024-11-27 ENCOUNTER — Ambulatory Visit: Admitting: Medical-Surgical

## 2024-11-27 MED ORDER — NITROFURANTOIN MONOHYD MACRO 100 MG PO CAPS: 100.0000 mg | ORAL_CAPSULE | Freq: Two times a day (BID) | ORAL | 0 refills | Status: AC

## 2024-11-29 LAB — URINE CULTURE

## 2025-01-17 ENCOUNTER — Encounter: Payer: Self-pay | Admitting: Medical-Surgical

## 2025-01-17 MED ORDER — ROSUVASTATIN CALCIUM 20 MG PO TABS: 20.0000 mg | ORAL_TABLET | Freq: Every day | ORAL | 1 refills | Status: AC

## 2025-02-08 ENCOUNTER — Telehealth (HOSPITAL_COMMUNITY): Admitting: Psychiatry
